# Patient Record
Sex: Female | Born: 1940 | Race: White | Hispanic: No | Marital: Married | Smoking: Former smoker
Health system: Southern US, Community
[De-identification: ages and names within clinical notes are randomized; demographics above are authoritative.]

## PROBLEM LIST (undated history)

## (undated) DIAGNOSIS — F419 Anxiety disorder, unspecified: Secondary | ICD-10-CM

## (undated) DIAGNOSIS — F329 Major depressive disorder, single episode, unspecified: Secondary | ICD-10-CM

## (undated) DIAGNOSIS — F32A Depression, unspecified: Secondary | ICD-10-CM

## (undated) DIAGNOSIS — E039 Hypothyroidism, unspecified: Secondary | ICD-10-CM

## (undated) DIAGNOSIS — M1990 Unspecified osteoarthritis, unspecified site: Secondary | ICD-10-CM

## (undated) DIAGNOSIS — N200 Calculus of kidney: Secondary | ICD-10-CM

## (undated) DIAGNOSIS — M109 Gout, unspecified: Secondary | ICD-10-CM

## (undated) HISTORY — PX: HEMORROIDECTOMY: SUR656

## (undated) HISTORY — PX: BACK SURGERY: SHX140

## (undated) HISTORY — PX: TONSILLECTOMY: SUR1361

## (undated) HISTORY — PX: EYE SURGERY: SHX253

---

## 1965-02-02 HISTORY — PX: DILATION AND CURETTAGE OF UTERUS: SHX78

## 1997-06-22 ENCOUNTER — Other Ambulatory Visit: Admission: RE | Admit: 1997-06-22 | Discharge: 1997-06-22 | Payer: Self-pay | Admitting: Family Medicine

## 1997-08-24 ENCOUNTER — Other Ambulatory Visit: Admission: RE | Admit: 1997-08-24 | Discharge: 1997-08-24 | Payer: Self-pay | Admitting: *Deleted

## 1998-03-14 ENCOUNTER — Ambulatory Visit (HOSPITAL_COMMUNITY): Admission: RE | Admit: 1998-03-14 | Discharge: 1998-03-14 | Payer: Self-pay | Admitting: Gastroenterology

## 1998-07-24 ENCOUNTER — Other Ambulatory Visit: Admission: RE | Admit: 1998-07-24 | Discharge: 1998-07-24 | Payer: Self-pay | Admitting: Internal Medicine

## 1998-07-30 ENCOUNTER — Encounter: Payer: Self-pay | Admitting: Emergency Medicine

## 1998-07-30 ENCOUNTER — Emergency Department (HOSPITAL_COMMUNITY): Admission: EM | Admit: 1998-07-30 | Discharge: 1998-07-30 | Payer: Self-pay | Admitting: Emergency Medicine

## 1999-08-14 ENCOUNTER — Other Ambulatory Visit: Admission: RE | Admit: 1999-08-14 | Discharge: 1999-08-14 | Payer: Self-pay | Admitting: Gynecology

## 1999-12-02 ENCOUNTER — Encounter: Payer: Self-pay | Admitting: Gynecology

## 1999-12-02 ENCOUNTER — Encounter: Admission: RE | Admit: 1999-12-02 | Discharge: 1999-12-02 | Payer: Self-pay | Admitting: Gynecology

## 2000-10-28 ENCOUNTER — Other Ambulatory Visit: Admission: RE | Admit: 2000-10-28 | Discharge: 2000-10-28 | Payer: Self-pay | Admitting: Gynecology

## 2000-12-09 ENCOUNTER — Encounter: Payer: Self-pay | Admitting: Internal Medicine

## 2000-12-09 ENCOUNTER — Encounter: Admission: RE | Admit: 2000-12-09 | Discharge: 2000-12-09 | Payer: Self-pay | Admitting: Internal Medicine

## 2000-12-10 ENCOUNTER — Ambulatory Visit (HOSPITAL_COMMUNITY): Admission: RE | Admit: 2000-12-10 | Discharge: 2000-12-10 | Payer: Self-pay | Admitting: Sports Medicine

## 2000-12-10 ENCOUNTER — Encounter: Payer: Self-pay | Admitting: Sports Medicine

## 2000-12-29 ENCOUNTER — Encounter: Payer: Self-pay | Admitting: Sports Medicine

## 2000-12-29 ENCOUNTER — Encounter: Admission: RE | Admit: 2000-12-29 | Discharge: 2000-12-29 | Payer: Self-pay | Admitting: Sports Medicine

## 2001-01-12 ENCOUNTER — Encounter: Admission: RE | Admit: 2001-01-12 | Discharge: 2001-01-12 | Payer: Self-pay | Admitting: Sports Medicine

## 2001-01-12 ENCOUNTER — Encounter: Payer: Self-pay | Admitting: Sports Medicine

## 2001-02-16 ENCOUNTER — Encounter: Payer: Self-pay | Admitting: Sports Medicine

## 2001-02-16 ENCOUNTER — Encounter: Admission: RE | Admit: 2001-02-16 | Discharge: 2001-02-16 | Payer: Self-pay | Admitting: Sports Medicine

## 2001-10-12 ENCOUNTER — Ambulatory Visit (HOSPITAL_COMMUNITY): Admission: RE | Admit: 2001-10-12 | Discharge: 2001-10-12 | Payer: Self-pay | Admitting: *Deleted

## 2002-04-30 ENCOUNTER — Emergency Department (HOSPITAL_COMMUNITY): Admission: EM | Admit: 2002-04-30 | Discharge: 2002-04-30 | Payer: Self-pay | Admitting: Emergency Medicine

## 2002-04-30 ENCOUNTER — Encounter: Payer: Self-pay | Admitting: Emergency Medicine

## 2002-05-03 ENCOUNTER — Ambulatory Visit (HOSPITAL_BASED_OUTPATIENT_CLINIC_OR_DEPARTMENT_OTHER): Admission: RE | Admit: 2002-05-03 | Discharge: 2002-05-03 | Payer: Self-pay | Admitting: Urology

## 2002-10-04 ENCOUNTER — Encounter: Payer: Self-pay | Admitting: Internal Medicine

## 2002-10-04 ENCOUNTER — Encounter: Admission: RE | Admit: 2002-10-04 | Discharge: 2002-10-04 | Payer: Self-pay | Admitting: Internal Medicine

## 2004-05-22 ENCOUNTER — Encounter: Admission: RE | Admit: 2004-05-22 | Discharge: 2004-05-22 | Payer: Self-pay | Admitting: Internal Medicine

## 2005-04-16 ENCOUNTER — Other Ambulatory Visit: Admission: RE | Admit: 2005-04-16 | Discharge: 2005-04-16 | Payer: Self-pay | Admitting: Internal Medicine

## 2005-08-13 ENCOUNTER — Encounter: Admission: RE | Admit: 2005-08-13 | Discharge: 2005-08-13 | Payer: Self-pay | Admitting: Internal Medicine

## 2006-10-27 ENCOUNTER — Encounter: Admission: RE | Admit: 2006-10-27 | Discharge: 2006-10-27 | Payer: Self-pay | Admitting: Internal Medicine

## 2006-11-12 ENCOUNTER — Encounter: Admission: RE | Admit: 2006-11-12 | Discharge: 2006-11-12 | Payer: Self-pay | Admitting: Internal Medicine

## 2007-05-17 ENCOUNTER — Ambulatory Visit (HOSPITAL_COMMUNITY): Admission: RE | Admit: 2007-05-17 | Discharge: 2007-05-18 | Payer: Self-pay | Admitting: Neurosurgery

## 2007-12-20 ENCOUNTER — Encounter: Admission: RE | Admit: 2007-12-20 | Discharge: 2007-12-20 | Payer: Self-pay | Admitting: Internal Medicine

## 2008-07-17 ENCOUNTER — Emergency Department (HOSPITAL_COMMUNITY): Admission: EM | Admit: 2008-07-17 | Discharge: 2008-07-17 | Payer: Self-pay | Admitting: Family Medicine

## 2009-01-14 ENCOUNTER — Encounter: Admission: RE | Admit: 2009-01-14 | Discharge: 2009-01-14 | Payer: Self-pay | Admitting: Internal Medicine

## 2009-09-02 ENCOUNTER — Encounter: Admission: RE | Admit: 2009-09-02 | Discharge: 2009-09-02 | Payer: Self-pay | Admitting: Internal Medicine

## 2010-02-06 ENCOUNTER — Encounter
Admission: RE | Admit: 2010-02-06 | Discharge: 2010-02-06 | Payer: Self-pay | Source: Home / Self Care | Attending: Internal Medicine | Admitting: Internal Medicine

## 2010-06-17 NOTE — Op Note (Signed)
NAMETRINTY, MARKEN NO.:  [id number]   MEDICAL RECORD NO.:  [id number]          PATIENT TYPE:  OIB   LOCATION:  3012                         FACILITY:  [facility]   PHYSICIAN:  Payton Doughty, M.D.      DATE OF BIRTH:  1940/11/25   DATE OF PROCEDURE:  05/17/2007  DATE OF DISCHARGE:                               OPERATIVE REPORT   PREOPERATIVE DIAGNOSIS:  Foraminal disk on the left side at L3-4.   POSTOPERATIVE DIAGNOSIS:  Foraminal disk on the left side at L3-4.   PROCEDURES:  Left L3-4 foraminal diskectomy.   SURGEON:  Payton Doughty, M.D.   ANESTHESIA:  Endotracheal.   PREP:  Prepped with alcohol wipe.   COMPLICATIONS:  None.   NURSE ASSISTANT:  [place].   BODY TEXT:  This is a 70 year old lady with a left L3 radiculopathy and  a foraminal disk on the left side at L3-4, taken to the operating room  where spinal anesthesia was induced, placed prone on the operating  table.  Following shave, prep, and drape in the usual sterile fashion,  the skin was infiltrated with 1% lidocaine with 1:100,000 epinephrine  and the skin was incised over top of the lamina of L3.  It was exposed  on the left side in the subperiosteal plane.  Intraoperative x-ray was  obtained to confirm correct level.  The inferior part, the lateral part  of the lamina, and the superior part of the inferior articular process  of L3, and the superior part of the inferior articular process of L4  were removed using a high-speed drill.  This allowed access to the  axillary area where the dorsal root ganglion of L3 and the lateral  margin of the thecal sac were identified after removal of ligamentum  flavum.  Immediately obvious was a foraminal disk bulge which had some  fissuring of the annulus causing mass effect on the dorsal root  ganglion.  This was removed without difficulty.  Following this removal,  the nerve extruded freely as in palpation on the thecal sac revealed no  compression there.   Wound was irrigated.  Hemostasis assured.  The  laminotomy defect was packed with Depo-Medrol-soaked pack.  Successive  layers of 0 Vicryl, 2-0 Vicryl, and 3-0 nylon were used to close.  Betadine and Telfa dressing was applied and then cleansed with OpSite.  The patient returned to the recovery room in good condition.           ______________________________  Payton Doughty, M.D.     MWR/MEDQ  D:  05/17/2007  T:  05/17/2007  Job:  [id number]

## 2010-06-17 NOTE — H&P (Signed)
Kerry Ramsey, Kerry Ramsey            ACCOUNT NO.:  [id number]   MEDICAL RECORD NO.:  [id number]          PATIENT TYPE:  AMB   LOCATION:  SDS                          FACILITY:  [facility]   PHYSICIAN:  Payton Doughty, M.D.      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   This 70 year old right-handed white lady has had back pain off and on  for a number of years.  Epidural steroid injections in the past have  helped.  In September, she had the onset of left groin pain.  MRI showed  a foraminal disk of the left side.  She was referred to me.  She  underwent epidural steroids.  It helped transiently.  Pain came back and  she is here now for a foraminal diskectomy.  Medical history is  remarkable for Graves disease.  She is on Synthroid, Paxil, and Fosamax.   ALLERGIES:  None.   SURGICAL HISTORY:  Hemorrhoid in 1969.   SOCIAL HISTORY:  She does not smoke, drinks on a social basis.   FAMILY HISTORY:  Both parents have deceased, history is not given.   REVIEW OF SYSTEMS:  CONSTITUTIONAL:  Remarkable for glasses, nasal  congestion, sinus problem, back pain, leg pain, and depression.  HEENT:  Within normal limits.  NECK:  She has reasonable range of motion of the  neck.  CHEST:  Clear.  CARDIAC:  Regular rate and rhythm.  ABDOMEN:  Nontender.  No hepatosplenomegaly.  EXTREMITIES:  Without clubbing or  cyanosis.  GU:  Exam deferred.  EXTREMITIES:  Peripheral pulses are  good.  NEUROLOGIC:  She is awake, alert, and oriented.  Cranial nerves  are intact.  Motor exam shows 5/5 strength throughout the upper and  lower extremities.  Sensory dysesthesias described at left L3  distribution.  Reflexes are 2 at the right knee, 1 at the left, 2 at the  ankles bilaterally.  Straight leg raise is negative.  There is no hip  irritability.   MRI shows foraminal disk on the left side at L3-L4.   CLINICAL IMPRESSION:  Left L3 radiculopathy secondary to foraminal disk.   The plan is for a foraminal diskectomy on the left side at L3-L4.  The  risks and benefits have been discussed with her and she wishes to  proceed.           ______________________________  Payton Doughty, M.D.     MWR/MEDQ  D:  05/17/2007  T:  05/17/2007  Job:  [id number]

## 2010-06-20 NOTE — Op Note (Signed)
Kerry Ramsey, Kerry Ramsey NO.:  [id number]   MEDICAL RECORD NO.:  [id number]                   PATIENT TYPE:  AMB   LOCATION:  NESC                                 FACILITY:  [facility]   PHYSICIAN:  Valetta Fuller, M.D.               DATE OF BIRTH:  09-04-1940   DATE OF PROCEDURE:  DATE OF DISCHARGE:                                 OPERATIVE REPORT   PREOPERATIVE DIAGNOSIS:  Left distal ureteral stone.   POSTOPERATIVE DIAGNOSIS:  Left distal ureteral stone.   PROCEDURE PERFORMED:  Cystoscopy, retrograde pyelography, ureteroscopy,  stone basketing, and double-J stent placement.   SURGEON:  Valetta Fuller, M.D.   ANESTHESIA:  General.   INDICATIONS:  Ms. Burkle is a 70 year old female.  She has a history of  nephrolithiasis.  She presented to the emergency room recently with severe  flank discomfort.  A CT showed mild hydronephrosis and what appeared to be a  very small, 1-2 mm distal left ureteral stone.  We saw the patient in  followup, and she was continuing to have pain.  We encouraged her to try to  pass this spontaneously, given its small size, but she needed to leave town  in the next couple of days for , and therefore, requested that  definitive intervention be performed.  She presented this morning for that  and was also having excruciating flank discomfort preoperatively.   TECHNIQUE AND FINDINGS:  The patient was brought to the operating room where  she had successful induction of general anesthesia.  She was placed in the  lithotomy position and prepped and draped in the usual manner.  A cystoscopy  revealed a markedly edematous and inflamed left hemitrigone.  I had really  never seen this kind of inflammatory reaction to such a small stone, based  on what we had seen on the CT scan.  I have only seen this type of  inflammation with a very large stone impacted in the intramural ureter.  Given the distortion of the trigone, it was  a little bit difficult to  actually get a wire into the orifice.  We had some difficulty advancing it,  but through the open end, we could see on retrograde pyelography that we  were indeed within the lumen, and the ureter itself appeared dilated with a  small filling defect distally.  We were eventually able to get a glide wire  up to the left renal pelvis.  I used the inside portion of an access sheath  to gently dilate the distal ureter.  After that was accomplished, we used a  6 [place] mini rigid ureteroscope.  Two 1 mm fragments were encountered,  which probably broke up upon inserting the dilating sheath.  Both of these  little fragments were basket extracted.  The entire intramural ureter  appeared to be markedly inflamed and edematous.  The more proximal aspect of  the ureter  was entirely normal.  Given the amount of edema, we felt a stent  should be placed.  Over the guide wire, I placed a 4.5 French 24 cm double-J  stent, which was done visually, as well as with fluoroscopic guidance.  The  patient appeared to tolerate the procedure well.  She was brought to the  recovery room in stable condition.                                               Valetta Fuller, M.D.    DSG/MEDQ  D:  05/03/2002  T:  05/03/2002  Job:  [id number]

## 2010-10-28 LAB — CBC
Platelets: 321
RDW: 13.4
WBC: 8.1

## 2010-10-28 LAB — COMPREHENSIVE METABOLIC PANEL
ALT: 21
AST: 20
Alkaline Phosphatase: 57
CO2: 26
Calcium: 9.6
Chloride: 104
GFR calc Af Amer: >60
Glucose, Bld: 89
Potassium: 3.4 — ABNORMAL LOW
Sodium: 140
Total Bilirubin: 0.3

## 2010-10-28 LAB — DIFFERENTIAL
Basophils Absolute: 0
Basophils Relative: 0
Eosinophils Absolute: 0.1
Monocytes Relative: 8
Neutro Abs: 5.4
Neutrophils Relative %: 66

## 2010-10-28 LAB — URINALYSIS, ROUTINE W REFLEX MICROSCOPIC
Bilirubin Urine: NEGATIVE
Ketones, ur: 15 — AB
Urobilinogen, UA: 1

## 2010-10-28 LAB — APTT: aPTT: 30

## 2010-10-28 LAB — PROTIME-INR: Prothrombin Time: 12.3

## 2010-12-11 ENCOUNTER — Other Ambulatory Visit: Payer: Self-pay | Admitting: Neurological Surgery

## 2010-12-11 DIAGNOSIS — M5416 Radiculopathy, lumbar region: Secondary | ICD-10-CM

## 2010-12-11 DIAGNOSIS — M549 Dorsalgia, unspecified: Secondary | ICD-10-CM

## 2010-12-16 ENCOUNTER — Ambulatory Visit
Admission: RE | Admit: 2010-12-16 | Discharge: 2010-12-16 | Disposition: A | Discharge status: Home / Self Care | Payer: Medicare Other | Source: Ambulatory Visit | Attending: Neurological Surgery | Admitting: Neurological Surgery

## 2010-12-16 VITALS — BP 148/78 | HR 67

## 2010-12-16 DIAGNOSIS — M549 Dorsalgia, unspecified: Secondary | ICD-10-CM

## 2010-12-16 DIAGNOSIS — M5416 Radiculopathy, lumbar region: Secondary | ICD-10-CM

## 2010-12-16 MED ORDER — IOHEXOL 180 MG/ML  SOLN
1.0000 mL | Freq: Once | INTRAMUSCULAR | Status: AC | PRN
  Administered 2010-12-16: 1 mL via EPIDURAL

## 2010-12-16 MED ORDER — METHYLPREDNISOLONE ACETATE 40 MG/ML INJ SUSP (RADIOLOG
120.0000 mg | Freq: Once | INTRAMUSCULAR | Status: AC
  Administered 2010-12-16: 120 mg via EPIDURAL

## 2011-02-12 DIAGNOSIS — M545 Low back pain, unspecified: Secondary | ICD-10-CM | POA: Diagnosis not present

## 2011-02-19 ENCOUNTER — Other Ambulatory Visit (HOSPITAL_COMMUNITY): Payer: Self-pay | Admitting: Neurological Surgery

## 2011-02-19 ENCOUNTER — Other Ambulatory Visit: Payer: Self-pay | Admitting: Neurological Surgery

## 2011-02-19 DIAGNOSIS — M5416 Radiculopathy, lumbar region: Secondary | ICD-10-CM

## 2011-02-19 DIAGNOSIS — H93299 Other abnormal auditory perceptions, unspecified ear: Secondary | ICD-10-CM | POA: Diagnosis not present

## 2011-02-19 DIAGNOSIS — M545 Low back pain, unspecified: Secondary | ICD-10-CM

## 2011-02-19 DIAGNOSIS — H6120 Impacted cerumen, unspecified ear: Secondary | ICD-10-CM | POA: Diagnosis not present

## 2011-03-05 ENCOUNTER — Ambulatory Visit (HOSPITAL_COMMUNITY)
Admission: RE | Admit: 2011-03-05 | Discharge: 2011-03-05 | Disposition: A | Discharge status: Home / Self Care | Payer: Medicare Other | Source: Ambulatory Visit | Attending: Neurological Surgery | Admitting: Neurological Surgery

## 2011-03-05 DIAGNOSIS — M51379 Other intervertebral disc degeneration, lumbosacral region without mention of lumbar back pain or lower extremity pain: Secondary | ICD-10-CM | POA: Insufficient documentation

## 2011-03-05 DIAGNOSIS — M5137 Other intervertebral disc degeneration, lumbosacral region: Secondary | ICD-10-CM | POA: Diagnosis not present

## 2011-03-05 DIAGNOSIS — IMO0002 Reserved for concepts with insufficient information to code with codable children: Secondary | ICD-10-CM | POA: Diagnosis not present

## 2011-03-05 DIAGNOSIS — M5416 Radiculopathy, lumbar region: Secondary | ICD-10-CM

## 2011-03-05 DIAGNOSIS — M4180 Other forms of scoliosis, site unspecified: Secondary | ICD-10-CM | POA: Insufficient documentation

## 2011-03-05 DIAGNOSIS — I700 Atherosclerosis of aorta: Secondary | ICD-10-CM | POA: Diagnosis not present

## 2011-03-05 DIAGNOSIS — M545 Low back pain, unspecified: Secondary | ICD-10-CM

## 2011-03-05 DIAGNOSIS — M4120 Other idiopathic scoliosis, site unspecified: Secondary | ICD-10-CM | POA: Diagnosis not present

## 2011-03-05 DIAGNOSIS — M439 Deforming dorsopathy, unspecified: Secondary | ICD-10-CM | POA: Diagnosis not present

## 2011-03-05 MED ORDER — DIAZEPAM 5 MG PO TABS
10.0000 mg | ORAL_TABLET | Freq: Once | ORAL | Status: AC
  Administered 2011-03-05: 10 mg via ORAL

## 2011-03-05 MED ORDER — ONDANSETRON HCL 4 MG/2ML IJ SOLN: 4.0000 mg | Freq: Four times a day (QID) | INTRAMUSCULAR | Status: DC | PRN

## 2011-03-05 MED ORDER — DIAZEPAM 5 MG PO TABS
ORAL_TABLET | ORAL | Status: AC
  Administered 2011-03-05: 10 mg via ORAL
  Filled 2011-03-05: qty 2

## 2011-03-05 MED ORDER — IOHEXOL 180 MG/ML  SOLN
20.0000 mL | Freq: Once | INTRAMUSCULAR | Status: AC | PRN
  Administered 2011-03-05: 20 mL via INTRATHECAL

## 2011-03-05 NOTE — Procedures (Signed)
Kerry Ramsey  #[id number]  DOB:  April 05, 1940  02/12/2011:  Kerry Ramsey returns to the office today.  When I had last seen her in November I suggested an epidural steroid injection which was done via the transforaminal route at L3-4 on the right side.  She notes she had good relief for about a day's time, but beyond that the pain returned.  This not unlike her previous experience with epidural steroid injections.    I again had the opportunity to review her radiographs and particularly noted on her pelvic film that the radiologist saw some bone islands above the right acetabulum.  She notes that she was told about these previously when she had a CT scan and they do appear to be stable compared to her previous films.    The findings on the lumbar spine demonstrate that she has that 22 to 23 degree curve between L4 and L1 and I noted that imaging of this process would best to be done with a myelogram and a myelogram CAT scan.  I discussed with her the technique of the myelogram and the fact that it is done as an outpatient procedure at the [facility].  She would like to consider this and proceed with that at some point in the future and I believe that it would be appropriate to determine whether anything can be done to help minimize or deconstruct this scoliosis or whether it would be better to consider further conservative management.  She did note that she gets some cramps into both her lower extremities and this may be a complicating feature of some significant stenosis, although I can't appreciate this on the plain x-rays themselves.  We will see what we can find on the myelogram.          [name], M.D./sv Kerry Pock. [place]  #[id number]  DOB:  11-13-1940  12/10/2010:  Kerry Ramsey returns to the office today.  She had been seen and followed by Dr. Trey Sailors and back in 2009 she had undergone surgical decompression of a foraminal disc protrusion at L3-4 on the left side.  She notes that prior to that  she had been having considerable problems with left leg pain and had a couple of nerve blocks which didn't seem to help much, but after the diskectomy she notes that the bulk of her left lower extremity pain and discomfort had gone away.  Nonetheless, she kept some pain in her back.  It was noted that she had a degenerative scoliosis and she had marked degenerative change at the L3-4 disc space.  Now, she notes that she gets mostly pain in the right side of the lumbosacral junction.  She is acutely tender to palpation in the region just above the posterior superior iliac crest near the lumbosacral junction.  Her motor function appears good in the lower extremities.    Today in the office we obtained some new plain radiographs of the lumbar spine which demonstrate that she has a 22 degree scoliosis from the bottom of L3 to the top of L1 and this has an apex to the right side, concavity is on the left side.  She does have some modest degenerative changes at L4-L5, particularly on that right side.    IMPRESSION:     The patient has degenerative lumbar scoliosis and currently she is suffering from pain right in the lumbosacral junction on the right side.  I have suggested that perhaps a transforaminal block at L4-L5 on the right  may help to alleviate the worst of the symptoms as I suspect they are mostly driven by L4.  Kerry Ramsey tells me that previously her injections did little to help more than just a day and I am hopeful that a block in the right place should give her more substantial relief.  If it is not helping her significantly, then I have suggested that she proceed with myelography and a postmyelogram CAT scan in an effort to better define the pathoanatomy in this region.          [name], M.D./sv

## 2011-03-05 NOTE — Procedures (Signed)
Pre op Dx: Lumbar spondylosis, scoliosis, myelopathy Post op Dx: March spondylosis, scoliosis, myelopathy Procedure: A more myelogram Surgeon: Kaesen Rodriguez Puncture level: L4-5 and L1 2 Fluid color: Hearing colorless Injection: 14 cc iohexol 180 Findings: Spondylosis with scoliosis and mid lumbar spine.

## 2011-03-09 ENCOUNTER — Telehealth (HOSPITAL_COMMUNITY): Payer: Self-pay

## 2011-03-10 ENCOUNTER — Other Ambulatory Visit: Payer: Self-pay | Admitting: Neurological Surgery

## 2011-04-24 ENCOUNTER — Encounter (HOSPITAL_COMMUNITY): Payer: Self-pay | Admitting: Pharmacy Technician

## 2011-04-28 ENCOUNTER — Other Ambulatory Visit (HOSPITAL_COMMUNITY): Payer: Self-pay | Admitting: *Deleted

## 2011-04-29 ENCOUNTER — Encounter (HOSPITAL_COMMUNITY)
Admission: RE | Admit: 2011-04-29 | Discharge: 2011-04-29 | Disposition: A | Discharge status: Home / Self Care | Payer: Medicare Other | Source: Ambulatory Visit | Attending: Neurological Surgery | Admitting: Neurological Surgery

## 2011-04-29 ENCOUNTER — Encounter (HOSPITAL_COMMUNITY): Payer: Self-pay

## 2011-04-29 HISTORY — DX: Calculus of kidney: N20.0

## 2011-04-29 HISTORY — DX: Major depressive disorder, single episode, unspecified: F32.9

## 2011-04-29 HISTORY — DX: Depression, unspecified: F32.A

## 2011-04-29 HISTORY — DX: Hypothyroidism, unspecified: E03.9

## 2011-04-29 HISTORY — DX: Anxiety disorder, unspecified: F41.9

## 2011-04-29 HISTORY — DX: Unspecified osteoarthritis, unspecified site: M19.90

## 2011-04-29 LAB — SURGICAL PCR SCREEN
MRSA, PCR: NEGATIVE
Staphylococcus aureus: POSITIVE — AB

## 2011-04-29 LAB — BASIC METABOLIC PANEL
BUN: 15 mg/dL (ref 6–23)
CO2: 23 mEq/L (ref 19–32)
Calcium: 9.2 mg/dL (ref 8.4–10.5)
Creatinine, Ser: 0.57 mg/dL (ref 0.50–1.10)

## 2011-04-29 LAB — CBC
HCT: 38.7 % (ref 36.0–46.0)
MCH: 30.8 pg (ref 26.0–34.0)
MCHC: 34.4 g/dL (ref 30.0–36.0)
RDW: 12.7 % (ref 11.5–15.5)

## 2011-04-29 LAB — ABO/RH: ABO/RH(D): A POS

## 2011-04-29 LAB — TYPE AND SCREEN: Antibody Screen: NEGATIVE

## 2011-04-29 NOTE — Pre-Procedure Instructions (Signed)
20 Kerry Ramsey  04/29/2011   Your procedure is scheduled on:  05/04/2011  Report to [facility] at 8:30a.m.  Call this number if you have problems the morning of surgery: [phone]   Remember:   Do not eat food:After Midnight.  May have clear liquids: up to 4 Hours before arrival.  NOTHING AFTER 4:30a.m.  Clear liquids include soda, tea, black coffee, apple or grape juice, broth.  Take these medicines the morning of surgery with A SIP OF WATER: Synthroid   Do not wear jewelry, make-up or nail polish.  Do not wear lotions, powders, or perfumes. You may wear deodorant.  Do not shave 48 hours prior to surgery.  Do not bring valuables to the hospital.  Contacts, dentures or bridgework may not be worn into surgery.  Leave suitcase in the car. After surgery it may be brought to your room.  For patients admitted to the hospital, checkout time is 11:00 AM the day of discharge.   Patients discharged the day of surgery will not be allowed to drive home.  Name and phone number of your driver: with SPOU[employer]  Special Instructions: CHG Shower Use Special Wash: 1/2 bottle night before surgery and 1/2 bottle morning of surgery.   Please read over the following fact sheets that you were given: Pain Booklet, Coughing and Deep Breathing, Blood Transfusion Information, MRSA Information and Surgical Site Infection Prevention

## 2011-05-01 ENCOUNTER — Other Ambulatory Visit (HOSPITAL_COMMUNITY): Payer: Medicare Other

## 2011-05-03 MED ORDER — CEFAZOLIN SODIUM 1-5 GM-% IV SOLN
1.0000 g | INTRAVENOUS | Status: DC
  Filled 2011-05-03: qty 50

## 2011-05-03 NOTE — H&P (Signed)
Kerry Ramsey is an 71 y.o. female.   Chief Complaint: Back and leg pain [id number] L. [place] #[id number] DOB:  1940/12/10 03/05/2011:  Kerry Ramsey had a myelogram today. I discussed the results with her in the hospital. She has evidence of advanced spondylitic disease at L2-3, 3-4, and 4-5, with 3-4 being the singular worst level. She has lateral listhesis and some lateral recess stenosis particularly worse on the right, which is the side of her worst lumbar radicular pain.    I have advised that she should undergo an anterior decompression via a lateral approach at L2-3, 3-4, and 4-5.  We would use an XLIF device to stabilize this area and then do percutaneous pedicle screws posteriorly at L2, L3, L4, and L5.  Thereafter, the patient should have good relief.    I discussed the nature of the surgery, the major risks, bleeding, potential for a spinal injury.  Notwithstanding these risks, I believe the patient is a good candidate for surgical decompression and arthrodesis. I noted that she would be expected to be in the hospital about four days and thereafter she would require the use of an external brace for a  period of about eight weeks. This would only be used when she is up and out of bed.  Her total recovery takes upwards of four to six months.  She would like to do the surgery some time in the beginning of April and we will try to accommodate her needs as best possible. We will call in some Tramadol 50 mg., #40, one po q6h prn pain.           [name], M.D./aft  [place]  #[id number]  DOB:  [birth year]  12/10/2010:  Kerry Ramsey returns to the office today.  She had been seen and followed by Dr. Trey Sailors and back in 2009 she had undergone surgical decompression of a foraminal disc protrusion at L3-4 on the left side.  She notes that prior to that she had been having considerable problems with left leg pain and had a couple of nerve blocks which didn't seem to help much, but after the  diskectomy she notes that the bulk of her left lower extremity pain and discomfort had gone away.  Nonetheless, she kept some pain in her back.  It was noted that she had a degenerative scoliosis and she had marked degenerative change at the L3-4 disc space.  Now, she notes that she gets mostly pain in the right side of the lumbosacral junction.  She is acutely tender to palpation in the region just above the posterior superior iliac crest near the lumbosacral junction.  Her motor function appears good in the lower extremities.    Today in the office we obtained some new plain radiographs of the lumbar spine which demonstrate that she has a 22 degree scoliosis from the bottom of L3 to the top of L1 and this has an apex to the right side, concavity is on the left side.  She does have some modest degenerative changes at L4-L5, particularly on that right side.    IMPRESSION:     The patient has degenerative lumbar scoliosis and currently she is suffering from pain right in the lumbosacral junction on the right side.  I have suggested that perhaps a transforaminal block at L4-L5 on the right may help to alleviate the worst of the symptoms as I suspect they are mostly driven by L4.  Kerry Ramsey tells me that previously her injections  did little to help more than just a day and I am hopeful that a block in the right place should give her more substantial relief.  If it is not helping her significantly, then I have suggested that she proceed with myelography and a postmyelogram CAT scan in an effort to better define the pathoanatomy in this region.          [name], M.D./sv   Past Medical History  Diagnosis Date  . Hypothyroidism     treatment for Graves disease  . Depression   . Anxiety     panic attack many yrs. ago   . Arthritis     OA- hands & knees, lumbar stenosis, radiculopathy   . Renal calculi     x3 episodes     Past Surgical History  Procedure Date  . Back surgery     2009,  diskectomy lumbar   . Hemorroidectomy     45 yrs. ago  . Tonsillectomy     as a child   . Eye surgery     cataracts removed bilateral - /w IOL     Family History  Problem Relation Age of Onset  . Anesthesia problems Neg Hx    Social History:  reports that she quit smoking about 23 years ago. She does not have any smokeless tobacco history on file. She reports that she drinks alcohol. She reports that she does not use illicit drugs.  Allergies: No Known Allergies  Medications Prior to Admission  Medication Dose Route Frequency Provider Last Rate Last Dose  . ceFAZolin (ANCEF) IVPB 1 g/50 mL premix  1 g Intravenous 60 min Pre-Op Barnett Abu, MD       Medications Prior to Admission  Medication Sig Dispense Refill  . calcium elemental as carbonate (BARIATRIC TUMS ULTRA) 400 MG tablet Chew 1,000 mg by mouth 2 (two) times daily.      . Doxylamine Succinate, Sleep, (SLEEP AID PO) Take 0.5 tablets by mouth at bedtime as needed. For sleep      . ibuprofen (ADVIL,MOTRIN) 600 MG tablet Take 600 mg by mouth every 6 (six) hours as needed. For pain      . levothyroxine (SYNTHROID, LEVOTHROID) 50 MCG tablet Take 50 mcg by mouth every other day. Alternate days with synthroid 75 mcg      . levothyroxine (SYNTHROID, LEVOTHROID) 75 MCG tablet Take 75 mcg by mouth every other day. Alternate days with synthroid 50 mcg      . PARoxetine (PAXIL) 30 MG tablet Take 30 mg by mouth every evening.       . traZODone (DESYREL) 50 MG tablet Take 25 mg by mouth at bedtime as needed.         No results found for this or any previous visit (from the past 48 hour(s)). No results found.  Review of Systems  Constitutional: Negative.   HENT: Negative.   Eyes: Negative.   Respiratory: Negative.   Cardiovascular: Negative.   Gastrointestinal: Negative.   Genitourinary: Negative.   Musculoskeletal: Positive for back pain and joint pain.  Skin: Negative.   Neurological: Positive for sensory change and focal  weakness.  Endo/Heme/Allergies: Negative.   Psychiatric/Behavioral: Negative.     There were no vitals taken for this visit. Physical Exam  Constitutional: She is oriented to person, place, and time. She appears well-developed and well-nourished.  HENT:  Head: Normocephalic and atraumatic.  Eyes: Conjunctivae and EOM are normal. Pupils are equal, round, and reactive to light.  Neck: Normal range of motion. Neck supple.  Cardiovascular: Normal rate, regular rhythm and normal heart sounds.   Respiratory: Effort normal and breath sounds normal.  GI: Soft. Bowel sounds are normal.  Musculoskeletal: She exhibits tenderness.  Neurological: She is alert and oriented to person, place, and time. She displays normal reflexes. No cranial nerve deficit. She exhibits normal muscle tone. Coordination normal.  Skin: Skin is warm and dry.  Psychiatric: She has a normal mood and affect. Her behavior is normal. Judgment and thought content normal.     Assessment/Plan Degenerative scoliosis with back and leg pain. Decompression anterolaterally L2 to L5.  Jacalyn Biggs J 05/03/2011, 10:03 PM

## 2011-05-04 ENCOUNTER — Inpatient Hospital Stay (HOSPITAL_COMMUNITY)
Admission: RE | Admit: 2011-05-04 | Discharge: 2011-05-09 | DRG: 458 | Disposition: A | Discharge status: Home / Self Care | Payer: Medicare Other | Source: Ambulatory Visit | Attending: Neurological Surgery | Admitting: Neurological Surgery

## 2011-05-04 ENCOUNTER — Encounter (HOSPITAL_COMMUNITY): Payer: Self-pay | Admitting: Anesthesiology

## 2011-05-04 ENCOUNTER — Encounter (HOSPITAL_COMMUNITY): Payer: Self-pay | Admitting: *Deleted

## 2011-05-04 ENCOUNTER — Inpatient Hospital Stay (HOSPITAL_COMMUNITY): Payer: Medicare Other

## 2011-05-04 ENCOUNTER — Encounter (HOSPITAL_COMMUNITY)
Admission: RE | Disposition: A | Discharge status: Home / Self Care | Payer: Self-pay | Source: Ambulatory Visit | Attending: Neurological Surgery

## 2011-05-04 ENCOUNTER — Inpatient Hospital Stay (HOSPITAL_COMMUNITY): Payer: Medicare Other | Admitting: Anesthesiology

## 2011-05-04 DIAGNOSIS — Z981 Arthrodesis status: Secondary | ICD-10-CM | POA: Diagnosis not present

## 2011-05-04 DIAGNOSIS — F3289 Other specified depressive episodes: Secondary | ICD-10-CM | POA: Diagnosis present

## 2011-05-04 DIAGNOSIS — IMO0002 Reserved for concepts with insufficient information to code with codable children: Secondary | ICD-10-CM | POA: Diagnosis not present

## 2011-05-04 DIAGNOSIS — E039 Hypothyroidism, unspecified: Secondary | ICD-10-CM | POA: Diagnosis present

## 2011-05-04 DIAGNOSIS — M1710 Unilateral primary osteoarthritis, unspecified knee: Secondary | ICD-10-CM | POA: Diagnosis present

## 2011-05-04 DIAGNOSIS — M47817 Spondylosis without myelopathy or radiculopathy, lumbosacral region: Secondary | ICD-10-CM | POA: Diagnosis not present

## 2011-05-04 DIAGNOSIS — M19049 Primary osteoarthritis, unspecified hand: Secondary | ICD-10-CM | POA: Diagnosis present

## 2011-05-04 DIAGNOSIS — M4310 Spondylolisthesis, site unspecified: Secondary | ICD-10-CM | POA: Diagnosis not present

## 2011-05-04 DIAGNOSIS — Z23 Encounter for immunization: Secondary | ICD-10-CM | POA: Diagnosis not present

## 2011-05-04 DIAGNOSIS — Z6828 Body mass index (BMI) 28.0-28.9, adult: Secondary | ICD-10-CM | POA: Diagnosis not present

## 2011-05-04 DIAGNOSIS — Z01812 Encounter for preprocedural laboratory examination: Secondary | ICD-10-CM

## 2011-05-04 DIAGNOSIS — Z87891 Personal history of nicotine dependence: Secondary | ICD-10-CM

## 2011-05-04 DIAGNOSIS — M419 Scoliosis, unspecified: Secondary | ICD-10-CM

## 2011-05-04 DIAGNOSIS — M545 Low back pain, unspecified: Secondary | ICD-10-CM | POA: Diagnosis not present

## 2011-05-04 DIAGNOSIS — M4120 Other idiopathic scoliosis, site unspecified: Principal | ICD-10-CM | POA: Diagnosis present

## 2011-05-04 DIAGNOSIS — M48061 Spinal stenosis, lumbar region without neurogenic claudication: Secondary | ICD-10-CM | POA: Diagnosis not present

## 2011-05-04 DIAGNOSIS — F329 Major depressive disorder, single episode, unspecified: Secondary | ICD-10-CM | POA: Diagnosis present

## 2011-05-04 DIAGNOSIS — Z79899 Other long term (current) drug therapy: Secondary | ICD-10-CM

## 2011-05-04 HISTORY — PX: ANTERIOR LAT LUMBAR FUSION: SHX1168

## 2011-05-04 SURGERY — ANTERIOR LATERAL LUMBAR FUSION 3 LEVELS
Anesthesia: General | Site: Spine Lumbar | Wound class: Clean

## 2011-05-04 MED ORDER — NEOSTIGMINE METHYLSULFATE 1 MG/ML IJ SOLN
INTRAMUSCULAR | Status: DC | PRN
  Administered 2011-05-04: 3 mg via INTRAVENOUS

## 2011-05-04 MED ORDER — LACTATED RINGERS IV SOLN
INTRAVENOUS | Status: DC | PRN
  Administered 2011-05-04 (×4): via INTRAVENOUS

## 2011-05-04 MED ORDER — MIDAZOLAM HCL 2 MG/2ML IJ SOLN
INTRAMUSCULAR | Status: AC
  Filled 2011-05-04: qty 2

## 2011-05-04 MED ORDER — PHENOL 1.4 % MT LIQD: 1.0000 | OROMUCOSAL | Status: DC | PRN

## 2011-05-04 MED ORDER — HYDROMORPHONE HCL PF 1 MG/ML IJ SOLN
INTRAMUSCULAR | Status: AC
  Filled 2011-05-04: qty 1

## 2011-05-04 MED ORDER — OXYCODONE-ACETAMINOPHEN 5-325 MG PO TABS
1.0000 | ORAL_TABLET | ORAL | Status: DC | PRN
  Administered 2011-05-05: 1 via ORAL
  Administered 2011-05-05 (×3): 2 via ORAL
  Administered 2011-05-05: 1 via ORAL
  Administered 2011-05-06 – 2011-05-07 (×7): 2 via ORAL
  Filled 2011-05-04 (×9): qty 2
  Filled 2011-05-04 (×2): qty 1
  Filled 2011-05-04: qty 2

## 2011-05-04 MED ORDER — GLYCOPYRROLATE 0.2 MG/ML IJ SOLN
INTRAMUSCULAR | Status: DC | PRN
  Administered 2011-05-04: 0.4 mg via INTRAVENOUS

## 2011-05-04 MED ORDER — ACETAMINOPHEN 325 MG PO TABS: 650.0000 mg | ORAL_TABLET | ORAL | Status: DC | PRN

## 2011-05-04 MED ORDER — HEMOSTATIC AGENTS (NO CHARGE) OPTIME: TOPICAL | Status: DC | PRN

## 2011-05-04 MED ORDER — SODIUM CHLORIDE 0.9 % IV SOLN
INTRAVENOUS | Status: AC
  Filled 2011-05-04: qty 500

## 2011-05-04 MED ORDER — EPHEDRINE SULFATE 50 MG/ML IJ SOLN
INTRAMUSCULAR | Status: DC | PRN
  Administered 2011-05-04: 5 mg via INTRAVENOUS
  Administered 2011-05-04 (×2): 10 mg via INTRAVENOUS
  Administered 2011-05-04: 5 mg via INTRAVENOUS

## 2011-05-04 MED ORDER — LACTATED RINGERS IV SOLN: INTRAVENOUS | Status: DC

## 2011-05-04 MED ORDER — ACETAMINOPHEN 650 MG RE SUPP: 650.0000 mg | RECTAL | Status: DC | PRN

## 2011-05-04 MED ORDER — MORPHINE SULFATE 2 MG/ML IJ SOLN: 0.0500 mg/kg | INTRAMUSCULAR | Status: DC | PRN

## 2011-05-04 MED ORDER — POLYETHYLENE GLYCOL 3350 17 G PO PACK
17.0000 g | PACK | Freq: Every day | ORAL | Status: DC | PRN
  Administered 2011-05-06 – 2011-05-08 (×2): 17 g via ORAL
  Filled 2011-05-04 (×2): qty 1

## 2011-05-04 MED ORDER — SODIUM CHLORIDE 0.9 % IV SOLN: INTRAVENOUS | Status: DC

## 2011-05-04 MED ORDER — BACITRACIN 50000 UNITS IM SOLR
INTRAMUSCULAR | Status: AC
  Filled 2011-05-04: qty 1

## 2011-05-04 MED ORDER — LIDOCAINE-EPINEPHRINE 1 %-1:100000 IJ SOLN
INTRAMUSCULAR | Status: DC | PRN
  Administered 2011-05-04: 15 mL

## 2011-05-04 MED ORDER — MORPHINE SULFATE 4 MG/ML IJ SOLN
1.0000 mg | INTRAMUSCULAR | Status: DC | PRN
  Administered 2011-05-05: 4 mg via INTRAVENOUS
  Filled 2011-05-04 (×2): qty 1

## 2011-05-04 MED ORDER — LIDOCAINE HCL (CARDIAC) 20 MG/ML IV SOLN
INTRAVENOUS | Status: DC | PRN
  Administered 2011-05-04: 50 mg via INTRAVENOUS

## 2011-05-04 MED ORDER — BUPIVACAINE HCL (PF) 0.5 % IJ SOLN
INTRAMUSCULAR | Status: DC | PRN
  Administered 2011-05-04: 15 mL

## 2011-05-04 MED ORDER — MENTHOL 3 MG MT LOZG: 1.0000 | LOZENGE | OROMUCOSAL | Status: DC | PRN

## 2011-05-04 MED ORDER — CEFAZOLIN SODIUM 1-5 GM-% IV SOLN
INTRAVENOUS | Status: DC | PRN
  Administered 2011-05-04: 1 g via INTRAVENOUS

## 2011-05-04 MED ORDER — PROPOFOL 10 MG/ML IV EMUL
INTRAVENOUS | Status: DC | PRN
  Administered 2011-05-04 (×5): 20 mg via INTRAVENOUS
  Administered 2011-05-04: 40 mg via INTRAVENOUS
  Administered 2011-05-04: 160 mg via INTRAVENOUS

## 2011-05-04 MED ORDER — SODIUM CHLORIDE 0.9 % IR SOLN
Status: DC | PRN
  Administered 2011-05-04 (×2)

## 2011-05-04 MED ORDER — 0.9 % SODIUM CHLORIDE (POUR BTL) OPTIME
TOPICAL | Status: DC | PRN
  Administered 2011-05-04 (×2): 1000 mL

## 2011-05-04 MED ORDER — LEVOTHYROXINE SODIUM 50 MCG PO TABS
50.0000 ug | ORAL_TABLET | ORAL | Status: DC
  Administered 2011-05-06 – 2011-05-08 (×2): 50 ug via ORAL
  Filled 2011-05-04 (×3): qty 1

## 2011-05-04 MED ORDER — HYDROMORPHONE HCL PF 1 MG/ML IJ SOLN
0.2500 mg | INTRAMUSCULAR | Status: DC | PRN
  Administered 2011-05-04 (×4): 0.5 mg via INTRAVENOUS

## 2011-05-04 MED ORDER — SODIUM CHLORIDE 0.9 % IV SOLN: 250.0000 mL | INTRAVENOUS | Status: DC

## 2011-05-04 MED ORDER — LEVOTHYROXINE SODIUM 75 MCG PO TABS
75.0000 ug | ORAL_TABLET | ORAL | Status: DC
  Administered 2011-05-05 – 2011-05-09 (×3): 75 ug via ORAL
  Filled 2011-05-04 (×3): qty 1

## 2011-05-04 MED ORDER — ONDANSETRON HCL 4 MG/2ML IJ SOLN
INTRAMUSCULAR | Status: DC | PRN
  Administered 2011-05-04: 4 mg via INTRAVENOUS

## 2011-05-04 MED ORDER — ALUM & MAG HYDROXIDE-SIMETH 200-200-20 MG/5ML PO SUSP: 30.0000 mL | Freq: Four times a day (QID) | ORAL | Status: DC | PRN

## 2011-05-04 MED ORDER — MIDAZOLAM HCL 5 MG/5ML IJ SOLN
INTRAMUSCULAR | Status: DC | PRN
  Administered 2011-05-04: 2 mg via INTRAVENOUS

## 2011-05-04 MED ORDER — SUCCINYLCHOLINE CHLORIDE 20 MG/ML IJ SOLN
INTRAMUSCULAR | Status: DC | PRN
  Administered 2011-05-04: 100 mg via INTRAVENOUS

## 2011-05-04 MED ORDER — TRAZODONE 25 MG HALF TABLET
25.0000 mg | ORAL_TABLET | Freq: Every evening | ORAL | Status: DC | PRN
Start: 2011-05-04 — End: 2011-05-09
  Administered 2011-05-04 – 2011-05-08 (×5): 25 mg via ORAL
  Filled 2011-05-04 (×6): qty 1

## 2011-05-04 MED ORDER — ONDANSETRON HCL 4 MG/2ML IJ SOLN
4.0000 mg | INTRAMUSCULAR | Status: DC | PRN
  Administered 2011-05-05 – 2011-05-08 (×2): 4 mg via INTRAVENOUS
  Filled 2011-05-04 (×2): qty 2

## 2011-05-04 MED ORDER — SODIUM CHLORIDE 0.9 % IJ SOLN
3.0000 mL | INTRAMUSCULAR | Status: DC | PRN
  Administered 2011-05-05: 3 mL via INTRAVENOUS

## 2011-05-04 MED ORDER — CEFAZOLIN SODIUM 1-5 GM-% IV SOLN
1.0000 g | Freq: Three times a day (TID) | INTRAVENOUS | Status: AC
  Administered 2011-05-04 – 2011-05-05 (×2): 1 g via INTRAVENOUS
  Filled 2011-05-04 (×3): qty 50

## 2011-05-04 MED ORDER — SODIUM CHLORIDE 0.9 % IJ SOLN
3.0000 mL | Freq: Two times a day (BID) | INTRAMUSCULAR | Status: DC
  Administered 2011-05-04 – 2011-05-05 (×2): 3 mL via INTRAVENOUS

## 2011-05-04 MED ORDER — PAROXETINE HCL 30 MG PO TABS
30.0000 mg | ORAL_TABLET | Freq: Every evening | ORAL | Status: DC
  Administered 2011-05-04: 30 mg via ORAL
  Filled 2011-05-04 (×2): qty 1

## 2011-05-04 MED ORDER — ROCURONIUM BROMIDE 100 MG/10ML IV SOLN
INTRAVENOUS | Status: DC | PRN
  Administered 2011-05-04: 40 mg via INTRAVENOUS

## 2011-05-04 MED ORDER — FENTANYL CITRATE 0.05 MG/ML IJ SOLN
INTRAMUSCULAR | Status: DC | PRN
Start: 2011-05-04 — End: 2011-05-04
  Administered 2011-05-04 (×2): 50 ug via INTRAVENOUS
  Administered 2011-05-04: 150 ug via INTRAVENOUS
  Administered 2011-05-04 (×5): 50 ug via INTRAVENOUS

## 2011-05-04 MED ORDER — THROMBIN 5000 UNITS EX SOLR: CUTANEOUS | Status: DC | PRN

## 2011-05-04 SURGICAL SUPPLY — 72 items
ADH SKN CLS APL DERMABOND .7 (GAUZE/BANDAGES/DRESSINGS) ×1
BAG DECANTER FOR FLEXI CONT (MISCELLANEOUS) ×2 IMPLANT
BLADE SURG ROTATE 9660 (MISCELLANEOUS) IMPLANT
BONE MATRIX OSTEOCEL PLUS 10CC (Bone Implant) ×2 IMPLANT
BONE MATRIX OSTEOCEL PLUS 5CC (Bone Implant) ×2 IMPLANT
CLOTH BEACON ORANGE TIMEOUT ST (SAFETY) ×4 IMPLANT
CONT SPEC 4OZ CLIKSEAL STRL BL (MISCELLANEOUS) ×2 IMPLANT
COROENT XL 8X22X45 (Orthopedic Implant) ×2 IMPLANT
COROENT XL WIDE LORDO 10X22X45 (Intraocular Lens) ×2 IMPLANT
COVER BACK TABLE 24X17X13 BIG (DRAPES) IMPLANT
COVER TABLE BACK 60X90 (DRAPES) ×2 IMPLANT
DERMABOND ADVANCED (GAUZE/BANDAGES/DRESSINGS) ×1
DERMABOND ADVANCED .7 DNX12 (GAUZE/BANDAGES/DRESSINGS) ×1 IMPLANT
DRAPE C-ARM 42X72 X-RAY (DRAPES) ×4 IMPLANT
DRAPE C-ARMOR (DRAPES) ×4 IMPLANT
DRAPE LAPAROTOMY 100X72X124 (DRAPES) ×4 IMPLANT
DRAPE POUCH INSTRU U-SHP 10X18 (DRAPES) ×4 IMPLANT
DURAPREP 26ML APPLICATOR (WOUND CARE) ×4 IMPLANT
ELECT BLADE 4.0 EZ CLEAN MEGAD (MISCELLANEOUS) ×2
ELECT BLADE 6.5 EXT (BLADE) ×2 IMPLANT
ELECT REM PT RETURN 9FT ADLT (ELECTROSURGICAL) ×4
ELECTRODE BLDE 4.0 EZ CLN MEGD (MISCELLANEOUS) ×1 IMPLANT
ELECTRODE REM PT RTRN 9FT ADLT (ELECTROSURGICAL) ×2 IMPLANT
GAUZE SPONGE 4X4 16PLY XRAY LF (GAUZE/BANDAGES/DRESSINGS) IMPLANT
GLOVE BIO SURGEON STRL SZ7 (GLOVE) ×4 IMPLANT
GLOVE BIOGEL PI IND STRL 7.0 (GLOVE) ×2 IMPLANT
GLOVE BIOGEL PI IND STRL 8.5 (GLOVE) ×3 IMPLANT
GLOVE BIOGEL PI INDICATOR 7.0 (GLOVE) ×2
GLOVE BIOGEL PI INDICATOR 8.5 (GLOVE) ×3
GLOVE ECLIPSE 8.5 STRL (GLOVE) ×4 IMPLANT
GLOVE EXAM NITRILE LRG STRL (GLOVE) IMPLANT
GLOVE EXAM NITRILE MD LF STRL (GLOVE) ×2 IMPLANT
GLOVE EXAM NITRILE XL STR (GLOVE) IMPLANT
GLOVE EXAM NITRILE XS STR PU (GLOVE) IMPLANT
GLOVE SURG SS PI 6.5 STRL IVOR (GLOVE) ×2 IMPLANT
GLOVE SURG SS PI 8.0 STRL IVOR (GLOVE) ×4 IMPLANT
GOWN BRE IMP SLV AUR LG STRL (GOWN DISPOSABLE) IMPLANT
GOWN BRE IMP SLV AUR XL STRL (GOWN DISPOSABLE) ×4 IMPLANT
GOWN STRL REIN 2XL LVL4 (GOWN DISPOSABLE) ×6 IMPLANT
GUIDE WIRE ×12 IMPLANT
IMPLANT COROENT XL 10X22X50MM (Intraocular Lens) ×2 IMPLANT
KIT BASIN OR (CUSTOM PROCEDURE TRAY) ×4 IMPLANT
KIT DILATOR XLIF 5 (KITS) ×1 IMPLANT
KIT MAXCESS (KITS) ×2 IMPLANT
KIT NEEDLE NVM5 EMG ELECT (KITS) ×1 IMPLANT
KIT NEEDLE NVM5 EMG ELECTRODE (KITS) ×1
KIT ROOM TURNOVER OR (KITS) ×2 IMPLANT
KIT XLIF (KITS) ×1
MARKER SKIN DUAL TIP RULER LAB (MISCELLANEOUS) ×4 IMPLANT
NEEDLE HYPO 25X1 1.5 SAFETY (NEEDLE) ×4 IMPLANT
NEEDLE TARGETING ILLICO (NEEDLE) ×4 IMPLANT
NS IRRIG 1000ML POUR BTL (IV SOLUTION) ×4 IMPLANT
PACK LAMINECTOMY NEURO (CUSTOM PROCEDURE TRAY) ×4 IMPLANT
PAD ARMBOARD 7.5X6 YLW CONV (MISCELLANEOUS) ×8 IMPLANT
ROD TI PRECONT ILLICO 5.5X11 (Rod) ×2 IMPLANT
ROD TI STRT ILLICO 5.5X120 (Rod) ×2 IMPLANT
SCREW CANN PA ILLICO 5.5X45 (Screw) ×4 IMPLANT
SCREW CANN PA ILLICO 6.5X45 (Screw) ×4 IMPLANT
SCREW CANNULATED 40MM (Screw) ×4 IMPLANT
SCREW SET SPINAL STD HEXALOBE (Screw) ×12 IMPLANT
SPONGE LAP 4X18 X RAY DECT (DISPOSABLE) IMPLANT
SPONGE SURGIFOAM ABS GEL SZ50 (HEMOSTASIS) ×2 IMPLANT
SUT VIC AB 1 CT1 18XBRD ANBCTR (SUTURE) IMPLANT
SUT VIC AB 1 CT1 8-18 (SUTURE)
SUT VIC AB 2-0 CP2 18 (SUTURE) ×8 IMPLANT
SUT VIC AB 3-0 SH 8-18 (SUTURE) ×10 IMPLANT
SYR 20ML ECCENTRIC (SYRINGE) ×4 IMPLANT
TAPE CLOTH 3X10 TAN LF (GAUZE/BANDAGES/DRESSINGS) ×6 IMPLANT
TOWEL OR 17X24 6PK STRL BLUE (TOWEL DISPOSABLE) ×4 IMPLANT
TOWEL OR 17X26 10 PK STRL BLUE (TOWEL DISPOSABLE) ×4 IMPLANT
TRAY FOLEY CATH 14FRSI W/METER (CATHETERS) ×2 IMPLANT
WATER STERILE IRR 1000ML POUR (IV SOLUTION) ×4 IMPLANT

## 2011-05-04 NOTE — OR Nursing (Signed)
1st part finished at 1651.

## 2011-05-04 NOTE — Preoperative (Signed)
Beta Blockers   Reason not to administer Beta Blockers:Not Applicable 

## 2011-05-04 NOTE — Anesthesia Preprocedure Evaluation (Addendum)
Anesthesia Evaluation  Patient identified by MRN, date of birth, ID band Patient awake    Reviewed: Allergy & Precautions, H&P , NPO status , Patient's Chart, lab work & pertinent test results  Airway Mallampati: II      Dental  (+) Teeth Intact   Pulmonary neg pulmonary ROS,  breath sounds clear to auscultation        Cardiovascular negative cardio ROS  Rhythm:Regular Rate:Normal     Neuro/Psych Anxiety Depression negative neurological ROS     GI/Hepatic negative GI ROS, Neg liver ROS,   Endo/Other  Hypothyroidism   Renal/GU negative Renal ROS     Musculoskeletal negative musculoskeletal ROS (+)   Abdominal   Peds  Hematology negative hematology ROS (+)   Anesthesia Other Findings   Reproductive/Obstetrics                         Anesthesia Physical Anesthesia Plan  ASA: II  Anesthesia Plan: General   Post-op Pain Management:    Induction: Intravenous  Airway Management Planned: Oral ETT  Additional Equipment:   Intra-op Plan:   Post-operative Plan: Extubation in OR  Informed Consent: I have reviewed the patients History and Physical, chart, labs and discussed the procedure including the risks, benefits and alternatives for the proposed anesthesia with the patient or authorized representative who has indicated his/her understanding and acceptance.     Plan Discussed with: CRNA  Anesthesia Plan Comments:         Anesthesia Quick Evaluation

## 2011-05-04 NOTE — Anesthesia Procedure Notes (Signed)
Procedure Name: Intubation Date/Time: 05/04/2011 1:15 PM Performed by: Sharlene Dory E Pre-anesthesia Checklist: Patient identified, Emergency Drugs available, Suction available, Patient being monitored and Timeout performed Patient Re-evaluated:Patient Re-evaluated prior to inductionOxygen Delivery Method: Circle system utilized Preoxygenation: Pre-oxygenation with 100% oxygen Intubation Type: IV induction Ventilation: Mask ventilation without difficulty Laryngoscope Size: Mac and 3 Grade View: Grade I Tube type: Oral Tube size: 7.5 mm Number of attempts: 1 Airway Equipment and Method: Stylet Placement Confirmation: ETT inserted through vocal cords under direct vision,  positive ETCO2 and breath sounds checked- equal and bilateral Secured at: 21 cm Tube secured with: Tape Dental Injury: Teeth and Oropharynx as per pre-operative assessment and Injury to lip

## 2011-05-04 NOTE — Transfer of Care (Signed)
Immediate Anesthesia Transfer of Care Note  Patient: Kerry Ramsey  Procedure(s) Performed: Procedure(s) (LRB): ANTERIOR LATERAL LUMBAR FUSION 3 LEVELS (N/A) LUMBAR PERCUTANEOUS PEDICLE SCREW 3 LEVEL (N/A)  Patient Location: PACU  Anesthesia Type: General  Level of Consciousness: awake, alert , oriented and patient cooperative  Airway & Oxygen Therapy: Patient Spontanous Breathing and Patient connected to nasal cannula oxygen  Post-op Assessment: Report given to PACU RN, Post -op Vital signs reviewed and stable and Patient moving all extremities X 4  Post vital signs: Reviewed and stable  Complications: No apparent anesthesia complications

## 2011-05-04 NOTE — Op Note (Signed)
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 Reconstruction with peek spacer and morselized allograft L2-3 L3-4 L4-5. 3. Posterior fixation segmentally with pedicle screws from L2-L5 percutaneously placed. #4. Electromyographic neural monitoring. Surgeon: Barnett Abu M.D. Assistant: Wyn Quaker M.D. Anesthesia: Gen. endotracheal  Indications: Kerry Ramsey is a 71 year old individual who has had significant back and right lower extremity pain she has evidence of advanced degenerative changes with scoliosis at L2-3 L3-4 L4-5 is marked spondylosis causing foraminal stenosis or nerve root compromise on the right side. She's been advised regarding the need for surgery using an anterolateral technique to decompress and stabilize the spine.  Procedure: Patient was brought to the operating room supine on a stretcher after the smooth induction of general endotracheal anesthesia the patient had electrodes placed in the major muscle groups of the lower extremities including the quadriceps biceps femoris the gluteus the tibialis anterior in the gastrocnemius. Monitoring was conducted during the entire procedure of the anterolateral decompression. There was notable hyperactivity of multiple myotomes during the procedure despite deepening anesthesia this may certainly monitoring difficult to interpret.  Patient was then placed in the right lateral decubitus position and radiographic confirmation was obtained for finality of the spine starting at the first level of L4-L5 and this was verified the patient was taped into position and the table was broken the appropriate amount. Fluoroscopic guidance was used to choose entry sites for the L4-5 the L3-4 and L2-3 sites. A posterior incision was created to enter the  retroperitoneal space and then by placing a finger into the retroperitoneal cavity of the lateral incisions could be connected. A cannula was then placed over the interspace of L4-L5 and its position was confirmed radiographically the K wire that was placed through the cannula into the disc space at L4-L5. A series of dilators were placed using electromyographic monitoring to make sure that no traversing nerve roots were nearby ultimately this was dilated to a 14 mm size. A dilating retractor was then used to uncover the interspace from the lateral aspect. Patient was placed into the disc space and this secured the retractor in place along with the lateral arm area and through this aperture the disc space was opened using a 15 blade and a combination of curettes and rongeurs were used to evacuate a moderate amount of severely degenerated and desiccated disc material a series of dilators were then passed to start the 16 mm paddle size and dilate this to a 10 mm size. The endplates were cleaned using a combination of curettes rongeurs and respiratory this space was evacuated totally and fluoroscopic guidance was used to check that the implant  would be placed appropriately. At L4-5-1 10 mm x 22 mm x 15 mm wide implant was chosen this was filled with morselized allograft and placed into the interspace using a series so paddles to protect the endplates. The paddles were then removed. Fluoroscopic verification of positioning was obtained. This was felt to be appropriate we then continued to L3-L4 were similar procedure was carried out. At L3-L4 the interspace was quite severely sclerotic this series of dilating tools and osteophyte removal tools were used to enter the disc space and then the disc space only dilated to 8 mm size at this level 8 mm x 22 mm x 45 mm wide peek spacer was used and this was placed and countersunk appropriately using  fluoroscopic guidance again this positioning was verified and attention was  turned to L2 L3 a third separate incision the dilating retractor was placed over the interspace of L2-3 a discectomy was completed the endplates were decorticated and here a 10 x 22 x 45 mm size peek spacer was placed into the interspace via indirect decompression for the nerve roots. In the end final radiographs identified good position of all implants and reduction up with US of the scoliosis at least a partial extent. The wounds were then irrigated copiously with antibiotic irrigating solution and closed with 3-0 Vicryl in inverted interrupted fashion. Patient was then turned prone.  After reprepping and redraping fluoroscopic guidance was used to localize pedicle entry sites at L2 bilaterally at L3 on the left L4 on the right and L5 bilaterally. Root individual stab incisions created with a #10 blade and Sheedy needles were used to pass through the pedicle into the vertebral body then a series of dilators was passed over small incision through the skin and pedicle screws were placed in this fashion percutaneously area and at L2-5 0.5 x 45 mm screws were placed at L3 and L4-5 0.5 x 40 mm screws were used at L5 6.5 x 45 mm screws were placed once the screws were individually tapped and placed at each level rods were placed on the left side at precontoured 110 mm rod fit nicely between the screw heads on the right side 120 mm straight rod was contoured to the appropriate curvature to fit between the screw heads this was placed and secured. Final positioning was checked with fluoroscopic guidance when this was verified the incisions were were then closed after irrigating copiously with antibiotic hearing solution. Blood loss for the entire procedure was estimated at approximately 150 cc patient was returned to the recovery room in stable condition.

## 2011-05-04 NOTE — Anesthesia Postprocedure Evaluation (Signed)
Anesthesia Post Note  Patient: Kerry Ramsey  Procedure(s) Performed: Procedure(s) (LRB): ANTERIOR LATERAL LUMBAR FUSION 3 LEVELS (N/A) LUMBAR PERCUTANEOUS PEDICLE SCREW 3 LEVEL (N/A)  Anesthesia type: general  Patient location: PACU  Post pain: Pain level controlled  Post assessment: Patient's Cardiovascular Status Stable  Last Vitals:  Filed Vitals:   05/04/11 2000  BP: 120/73  Pulse: 102  Temp:   Resp: 19    Post vital signs: Reviewed and stable  Level of consciousness: sedated  Complications: No apparent anesthesia complications

## 2011-05-05 MED ORDER — KETOROLAC TROMETHAMINE 15 MG/ML IJ SOLN
15.0000 mg | Freq: Four times a day (QID) | INTRAMUSCULAR | Status: AC
  Administered 2011-05-05 – 2011-05-06 (×4): 15 mg via INTRAVENOUS
  Filled 2011-05-05 (×5): qty 1

## 2011-05-05 MED ORDER — PAROXETINE HCL 30 MG PO TABS
30.0000 mg | ORAL_TABLET | Freq: Every day | ORAL | Status: DC
  Administered 2011-05-05 – 2011-05-08 (×4): 30 mg via ORAL
  Filled 2011-05-05 (×5): qty 1

## 2011-05-05 NOTE — Evaluation (Signed)
Agree with student PT evaluation.  Maysoon Lozada, PT DPT [phone]  

## 2011-05-05 NOTE — Progress Notes (Signed)
OT Cancellation Note  Treatment cancelled today due to:  Pt currently without back brace for OT evaluation. OT attempting in AM and PM with no brace present in room. OT to re attempt 05/06/11.    Harrel Carina [place]   OTR/L Pager: [phone] Office: [phone] .

## 2011-05-05 NOTE — Progress Notes (Signed)
Subjective: Patient reports Complains of soreness throughout back and lower extremity. Comfort level moderate.  Objective: Vital signs in last 24 hours: Temp:  [97.2 F (36.2 C)-98.4 F (36.9 C)] 97.6 F (36.4 C) (04/02 0800) Pulse Rate:  [71-126] 71  (04/02 1000) Resp:  [5-29] 15  (04/02 1000) BP: (94-146)/(45-73) 117/52 mmHg (04/02 1000) SpO2:  [94 %-100 %] 99 % (04/02 1000) Weight:  [73.3 kg (161 lb 9.6 oz)-77.2 kg (170 lb 3.1 oz)] 77.2 kg (170 lb 3.1 oz) (04/01 2348)  Intake/Output from previous day: 04/01 0701 - 04/02 0700 In: 4325 [I.V.:4325] Out: 1195 [Urine:945; Blood:250] Intake/Output this shift: Total I/O In: 225 [I.V.:225] Out: 295 [Urine:295]  Incisions clean and dry. Motor function good in iliopsoas quadriceps tibialis anterior and gastrocs.  Lab Results: No results found for this basename: WBC:2,HGB:2,HCT:2,PLT:2 in the last 72 hours BMET No results found for this basename: NA:2,K:2,CL:2,CO2:2,GLUCOSE:2,BUN:2,CREATININE:2,CALCIUM:2 in the last 72 hours  Studies/Results: Dg Lumbar Spine 2-3 Views  05/04/2011  *RADIOLOGY REPORT*  Clinical Data: L2-L5 discectomy and fusion  LUMBAR SPINE - 2-3 VIEW  Comparison: 03/05/2011  Findings: Two C-arm images show lateral discectomy at L2-3, L3-4 and L4-5.  Interbody material appears well positioned.  There is pedicle screw and posterior rod fixation with bilateral screws at L2 and L5, left sided screw at L3 and right-sided screw at L4.  IMPRESSION: Lateral discectomy and posterior fusion from L2-L5.  Original Report Authenticated By: Thomasenia Sales, M.D.    Assessment/Plan: Day 1 postop 3 level anterolateral decompression and fusion. In addition stable  LOS: 1 day  Mobilized with use of brace and transfer to floor.   Riyanna Crutchley J 05/05/2011, 10:20 AM

## 2011-05-05 NOTE — Progress Notes (Signed)
UR COMPLETED  

## 2011-05-05 NOTE — Evaluation (Signed)
Physical Therapy Evaluation Patient Details Name: Kerry Ramsey MRN: [id number] DOB: 30-Nov-1940 Today's Date: 05/05/2011  Problem List: There is no problem list on file for this patient.   Past Medical History:  Past Medical History  Diagnosis Date  . Hypothyroidism     treatment for Graves disease  . Depression   . Anxiety     panic attack many yrs. ago   . Arthritis     OA- hands & knees, lumbar stenosis, radiculopathy   . Renal calculi     x3 episodes    Past Surgical History:  Past Surgical History  Procedure Date  . Back surgery     2009, diskectomy lumbar   . Hemorroidectomy     45 yrs. ago  . Tonsillectomy     as a child   . Eye surgery     cataracts removed bilateral - /w IOL     PT Assessment/Plan/Recommendation PT Assessment Clinical Impression Statement: Pt is a 71 y.o. female s/p a L2-L3, L3-L4, and L4-L5 decompression with resulting pain, impaired gait, and decreased mobility.  Pt will benefit from skilled physical therapy in the acute setting to address this impairments. PT Recommendation/Assessment: Patient will need skilled PT in the acute care venue PT Problem List: Decreased activity tolerance;Decreased balance;Decreased mobility;Decreased knowledge of use of DME;Decreased knowledge of precautions;Pain Barriers to Discharge: None PT Therapy Diagnosis : Difficulty walking;Abnormality of gait;Acute pain PT Plan PT Frequency: Min 5X/week PT Treatment/Interventions: DME instruction;Gait training;Stair training;Functional mobility training;Therapeutic activities;Balance training;Neuromuscular re-education;Patient/family education PT Recommendation Follow Up Recommendations: Home health PT Equipment Recommended: Rolling walker with 5" wheels;Tub/shower seat PT Goals  Acute Rehab PT Goals PT Goal Formulation: With patient Time For Goal Achievement: 7 days Pt will Roll Supine to Left Side: with modified independence;with rail PT Goal: Rolling Supine  to Left Side - Progress: Goal set today Pt will go Supine/Side to Sit: with modified independence;with rail PT Goal: Supine/Side to Sit - Progress: Goal set today Pt will go Sit to Supine/Side: with modified independence;with rail PT Goal: Sit to Supine/Side - Progress: Goal set today Pt will go Sit to Stand: with supervision;with upper extremity assist PT Goal: Sit to Stand - Progress: Goal set today Pt will go Stand to Sit: with supervision;with upper extremity assist PT Goal: Stand to Sit - Progress: Goal set today Pt will Ambulate: 51 - 150 feet;with supervision;with least restrictive assistive device PT Goal: Ambulate - Progress: Goal set today Pt will Go Up / Down Stairs: 3-5 stairs;with supervision;with least restrictive assistive device PT Goal: Up/Down Stairs - Progress: Goal set today Additional Goals Additional Goal #1: Pt will be able to demonstrate and describe 3/3 back precautions. PT Goal: Additional Goal #1 - Progress: Goal set today  PT Evaluation Precautions/Restrictions  Precautions Precautions: Back Precaution Booklet Issued: Yes (comment) Precaution Comments: Reviewed back precautions with patient. Required Braces or Orthoses: Yes Spinal Brace: Lumbar corset;Applied in sitting position Restrictions Weight Bearing Restrictions: No Prior Functioning  Home Living Lives With: Spouse Receives Help From: Family Type of Home: Other (Comment) (Condo) Home Layout: Able to live on main level with bedroom/bathroom Home Access: Stairs to enter Entrance Stairs-Rails: None Entrance Stairs-Number of Steps: 3 Bathroom Shower/Tub: Psychologist, counselling;Door [employer] Toilet: Handicapped height Bathroom Accessibility: Yes How Accessible: Accessible via walker Home Adaptive Equipment: Grab bars in shower Prior Function Level of Independence: Independent with basic ADLs;Independent with homemaking with ambulation;Independent with gait;Independent with transfers Able to Take  Stairs?: Yes Driving: Yes Vocation: Full time employment  Vocation Requirements: IV nurse Cognition Cognition Arousal/Alertness: Awake/alert Overall Cognitive Status: Appears within functional limits for tasks assessed Orientation Level: Oriented X4 Sensation/Coordination Sensation Light Touch: Appears Intact Coordination Gross Motor Movements are Fluid and Coordinated: Yes Fine Motor Movements are Fluid and Coordinated: Yes Extremity Assessment   Mobility (including Balance) Bed Mobility Bed Mobility: Yes Rolling Left: Other (comment);With rail (Min guard) Rolling Left Details (indicate cue type and reason): Pt required mni guard to initiate rolling and verbal cuing to maintain back precautions. Left Sidelying to Sit: 4: Min assist;With rails Left Sidelying to Sit Details (indicate cue type and reason): Pt required min assist in order to carry out transition from side-lying to sitting at EOB. Sitting - Scoot to Edge of Bed: 6: Modified independent (Device/Increase time);With rail Transfers Transfers: Yes Sit to Stand: 3: Mod assist;From bed;From chair/3-in-1;With upper extremity assist Sit to Stand Details (indicate cue type and reason): Pt requires mod assist in order to initiate transfer with verbal cues for hand placement with RW. Stand to Sit: 4: Min assist;With upper extremity assist;With armrests;To chair/3-in-1 Stand to Sit Details: Pt required min assist and UE assist to control descent into chair with verbal cues for hand placement with RW. Ambulation/Gait Ambulation/Gait: Yes Ambulation/Gait Assistance: 4: Min assist Ambulation/Gait Assistance Details (indicate cue type and reason): Pt required min assist to maintain balance and for safety. Ambulation Distance (Feet): 25 Feet Assistive device: Rolling walker Gait Pattern: Step-to pattern;Decreased stride length Gait velocity: Decreased Stairs: No Wheelchair Mobility Wheelchair Mobility: No  Posture/Postural  Control Posture/Postural Control: No significant limitations Balance Balance Assessed: Yes Static Sitting Balance Static Sitting - Balance Support: Feet supported;Bilateral upper extremity supported Static Sitting - Level of Assistance: 5: Stand by assistance Static Sitting - Comment/# of Minutes: 5 minutes Exercise    End of Session PT - End of Session Equipment Utilized During Treatment: Gait belt;Back brace Activity Tolerance: Patient limited by pain Patient left: in chair;with call bell in reach Nurse Communication: Mobility status for transfers;Mobility status for ambulation General Behavior During Session: [facility] for tasks performed Cognition: [facility] for tasks performed  Ezzard Standing SPT 05/05/2011, 3:19 PM

## 2011-05-06 ENCOUNTER — Encounter (HOSPITAL_COMMUNITY): Payer: Self-pay | Admitting: Neurological Surgery

## 2011-05-06 MED ORDER — PNEUMOCOCCAL VAC POLYVALENT 25 MCG/0.5ML IJ INJ
0.5000 mL | INJECTION | INTRAMUSCULAR | Status: AC
  Filled 2011-05-06: qty 0.5

## 2011-05-06 NOTE — Progress Notes (Signed)
Agree with student PT treatment note. Charmane Protzman, PT DPT [phone]  

## 2011-05-06 NOTE — Progress Notes (Signed)
OT Discharge Note  Patient is being discharged from OT services secondary to:    Goals met and no further therapy needs identified.  Please see latest Therapy Progress Note for current level of functioning and progress toward goals.  Progress and discharge plan and discussed with patient/caregiver and they    Agree    Johnston, Jahnae Mcadoo Brynn   OTR/L Pager: [phone] Office: [phone] .     

## 2011-05-06 NOTE — Progress Notes (Signed)
Subjective: Patient reports Reports generalized fatigue and achiness throughout back and lower extremity  Objective: Vital signs in last 24 hours: Temp:  [97.8 F (36.6 C)-98.5 F (36.9 C)] 98.2 F (36.8 C) (04/03 1800) Pulse Rate:  [74-81] 76  (04/03 1800) Resp:  [17-20] 18  (04/03 1800) BP: (100-117)/(57-70) 117/69 mmHg (04/03 1800) SpO2:  [90 %-95 %] 91 % (04/03 1800) Weight:  [80.7 kg (177 lb 14.6 oz)] 80.7 kg (177 lb 14.6 oz) (04/03 0603)  Intake/Output from previous day: 04/02 0701 - 04/03 0700 In: 585 [P.O.:360; I.V.:225] Out: 320 [Urine:320] Intake/Output this shift:    Incisions remain clean and dry motor function is good in major groups of lower extremities.  Lab Results: No results found for this basename: WBC:2,HGB:2,HCT:2,PLT:2 in the last 72 hours BMET No results found for this basename: NA:2,K:2,CL:2,CO2:2,GLUCOSE:2,BUN:2,CREATININE:2,CALCIUM:2 in the last 72 hours  Studies/Results: No results found.  Assessment/Plan: Stable postop day 2  LOS: 2 days  Continue ambulatory status discharge home when ready   Kaiana Marion J 05/06/2011, 7:26 PM

## 2011-05-06 NOTE — Evaluation (Signed)
Occupational Therapy Evaluation Patient Details Name: SYNA GAD MRN: [id number] DOB: 1940-11-16 Today's Date: 05/06/2011  Problem List: There is no problem list on file for this patient.   Past Medical History:  Past Medical History  Diagnosis Date  . Hypothyroidism     treatment for Graves disease  . Depression   . Anxiety     panic attack many yrs. ago   . Arthritis     OA- hands & knees, lumbar stenosis, radiculopathy   . Renal calculi     x3 episodes    Past Surgical History:  Past Surgical History  Procedure Date  . Back surgery     2009, diskectomy lumbar   . Hemorroidectomy     45 yrs. ago  . Tonsillectomy     as a child   . Eye surgery     cataracts removed bilateral - /w IOL   . Anterior lat lumbar fusion 05/04/2011    Procedure: ANTERIOR LATERAL LUMBAR FUSION 3 LEVELS;  Surgeon: Barnett Abu, MD;  Location: [facility];  Service: Neurosurgery;  Laterality: N/A;  Lumbar Two-Three, Lumbar Three-Four, Lumbar Four-Five Anterolateral decompression/arthrodesis.    OT Assessment/Plan/Recommendation OT Assessment Clinical Impression Statement: 71 yo female s/p fusion L2-5 is at adequate level for d/c home. Recommend Shower seat and RW for d/c. OT to sign off OT Recommendation/Assessment: Patient does not need any further OT services OT Recommendation Follow Up Recommendations: No OT follow up Equipment Recommended: Rolling walker with 5" wheels;Tub/shower seat OT Goals    OT Evaluation Precautions/Restrictions  Precautions Precautions: Back Precaution Booklet Issued: Yes (comment) Precaution Comments: Reviewed back precautions with patient. Required Braces or Orthoses: Yes Spinal Brace: Lumbar corset;Applied in sitting position Restrictions Weight Bearing Restrictions: No Prior Functioning Home Living Lives With: Spouse Receives Help From: Family Type of Home: Other (Comment) (condo) Home Layout: Able to live on main level with  bedroom/bathroom Home Access: Stairs to enter Entrance Stairs-Rails: None Entrance Stairs-Number of Steps: 3 Bathroom Shower/Tub: Psychologist, counselling;Door [employer] Toilet: Handicapped height Bathroom Accessibility: Yes How Accessible: Accessible via walker Home Adaptive Equipment: Grab bars in shower Prior Function Level of Independence: Independent with basic ADLs;Independent with homemaking with ambulation;Independent with gait;Independent with transfers Able to Take Stairs?: Yes Driving: Yes Vocation: Full time employment Vocation Requirements: IV nurse ADL ADL Eating/Feeding: Performed;Modified independent Where Assessed - Eating/Feeding: Chair Grooming: Performed;Wash/dry hands;Modified independent Where Assessed - Grooming: Standing at sink Where Assessed - Upper Body Dressing: Sitting, bed;Unsupported (able to don brace s/p education) Lower Body Dressing: Performed;Moderate assistance Lower Body Dressing Details (indicate cue type and reason): pt unable to cross Bil LE and pt with husband to (A) at d/c . Declined LB AE education.  Where Assessed - Lower Body Dressing: Sitting, bed;Unsupported Toilet Transfer: Simulated;Modified independent Toilet Transfer Method: Proofreader: Raised toilet seat with arms (or 3-in-1 over toilet) (min v/c for hand placement) Toileting - Clothing Manipulation: Simulated;Supervision/safety Where Assessed - Toileting Clothing Manipulation: Sit to stand from 3-in-1 or toilet Toileting - Hygiene: Simulated;Supervision/safety Where Assessed - Toileting Hygiene: Sit to stand from 3-in-1 or toilet Tub/Shower Transfer: Performed;Supervision/safety Tub/Shower Transfer Method: Science writer: Shower seat with back Equipment Used: Rolling walker Ambulation Related to ADLs: PT ambulating Mod I during session for ADLS. PT to ambulate to restroom with staff assist or husband may (A) ADL Comments: Pt recalled 3  out 3 back precautions. Pt educated on brace positioning and proper fit. Pt 100% recall at end of session. Pt with  back handout in room from PT session. PT at adequate level for d/c Vision/Perception  Vision - History Baseline Vision: Wears glasses all the time Patient Visual Report: No change from baseline Cognition Cognition Arousal/Alertness: Awake/alert Overall Cognitive Status: Appears within functional limits for tasks assessed Orientation Level: Oriented X4 Sensation/Coordination Sensation Light Touch: Appears Intact Coordination Gross Motor Movements are Fluid and Coordinated: Yes Fine Motor Movements are Fluid and Coordinated: Yes Extremity Assessment RUE Assessment RUE Assessment: Within Functional Limits LUE Assessment LUE Assessment: Within Functional Limits Mobility  Bed Mobility Bed Mobility: Yes Rolling Left: 5: Supervision Rolling Left Details (indicate cue type and reason): min guard to initiate rolling and verbal cuing to maintain back precautions. Left Sidelying to Sit: 4: Min assist;HOB flat Left Sidelying to Sit Details (indicate cue type and reason): min assist in order to carry out transition from side-lying to sitting at EOB. Sitting - Scoot to Edge of Bed: 6: Modified independent (Device/Increase time);With rail Transfers Transfers: Yes Sit to Stand: Other (comment) (Min Guard (A)) Sit to Stand Details (indicate cue type and reason): v/c for hand placement Exercises   End of Session OT - End of Session Equipment Utilized During Treatment: Gait belt Activity Tolerance: Patient tolerated treatment well Patient left: in chair;with call bell in reach Nurse Communication: Mobility status for transfers;Mobility status for ambulation General Behavior During Session: [facility] for tasks performed Cognition: [facility] for tasks performed   Lucile Shutters 05/06/2011, 2:53 PM  Pager: [phone]

## 2011-05-06 NOTE — Progress Notes (Signed)
Physical Therapy Treatment Patient Details Name: Kerry Ramsey MRN: [id number] DOB: 09/18/1940 Today's Date: 05/06/2011  PT Assessment/Plan  PT - Assessment/Plan Comments on Treatment Session: Pt was much more alert and awake today.  Pt able to ambulate 60 feet.  Pt must do stairs prior to d/c home. PT Plan: Discharge plan remains appropriate;Frequency remains appropriate PT Frequency: Min 5X/week Follow Up Recommendations: Home health PT Equipment Recommended: Rolling walker with 5" wheels;Tub/shower seat PT Goals  Acute Rehab PT Goals PT Goal Formulation: With patient Time For Goal Achievement: 7 days Pt will go Sit to Supine/Side: with modified independence;with rail PT Goal: Sit to Supine/Side - Progress: Progressing toward goal Pt will go Sit to Stand: with supervision;with upper extremity assist PT Goal: Sit to Stand - Progress: Progressing toward goal Pt will go Stand to Sit: with supervision;with upper extremity assist PT Goal: Stand to Sit - Progress: Progressing toward goal Pt will Ambulate: 51 - 150 feet;with supervision;with least restrictive assistive device PT Goal: Ambulate - Progress: Progressing toward goal Additional Goals Additional Goal #1: Pt will be able to demonstrate and describe 3/3 back precautions. PT Goal: Additional Goal #1 - Progress: Progressing toward goal  PT Treatment Precautions/Restrictions  Precautions Precautions: Back Precaution Booklet Issued: Yes (comment) Precaution Comments: Reviewed back precautions with patient. Required Braces or Orthoses: Yes Spinal Brace: Lumbar corset;Applied in sitting position Restrictions Weight Bearing Restrictions: No Mobility (including Balance) Bed Mobility Bed Mobility: Yes Rolling Right: 5: Supervision Rolling Right Details (indicate cue type and reason): Pt required VC for sequencing to observe back precautions. Rolling Left: 5: Supervision Rolling Left Details (indicate cue type and reason):  min guard to initiate rolling and verbal cuing to maintain back precautions. Left Sidelying to Sit: 4: Min assist;HOB flat Left Sidelying to Sit Details (indicate cue type and reason): min assist in order to carry out transition from side-lying to sitting at EOB. Sitting - Scoot to Edge of Bed: 6: Modified independent (Device/Increase time);With rail Sit to Sidelying Left: 5: Supervision Sit to Sidelying Left Details (indicate cue type and reason): Pt required supervision for safety. Transfers Transfers: Yes Sit to Stand: Other (comment);With upper extremity assist;With armrests;From chair/3-in-1 (Min guard) Sit to Stand Details (indicate cue type and reason): Pt required min guard for safety with VC for hand placement with RW. Stand to Sit: Other (comment);With upper extremity assist;To bed (Min guard) Stand to Sit Details: Pt required min guard for safety with VC for hand placement with RW. Ambulation/Gait Ambulation/Gait: Yes Ambulation/Gait Assistance: 4: Min assist Ambulation/Gait Assistance Details (indicate cue type and reason): Pt required min assist to maintain balance and for safety. Ambulation Distance (Feet): 60 Feet Assistive device: Rolling walker Gait Pattern: Step-to pattern;Decreased stride length Gait velocity: Decreased Stairs: No Wheelchair Mobility Wheelchair Mobility: No  Posture/Postural Control Posture/Postural Control: No significant limitations Balance Balance Assessed: No Exercise    End of Session PT - End of Session Equipment Utilized During Treatment: Gait belt;Back brace Activity Tolerance: Patient limited by pain;Patient limited by fatigue Patient left: in bed;with call bell in reach;with family/visitor present;Other (comment) (Nurse present) Nurse Communication: Mobility status for transfers;Mobility status for ambulation General Behavior During Session: [facility] for tasks performed Cognition: [facility] for tasks performed  Ezzard Standing SPT 05/06/2011,  3:22 PM

## 2011-05-07 ENCOUNTER — Inpatient Hospital Stay (HOSPITAL_COMMUNITY): Payer: Medicare Other

## 2011-05-07 MED ORDER — HYDROMORPHONE HCL 2 MG PO TABS
2.0000 mg | ORAL_TABLET | ORAL | Status: DC | PRN
  Administered 2011-05-07 – 2011-05-08 (×6): 2 mg via ORAL
  Filled 2011-05-07 (×6): qty 1

## 2011-05-07 NOTE — Progress Notes (Signed)
Agree with student PT treatment note. Ozil Stettler, PT DPT [phone]  

## 2011-05-07 NOTE — Progress Notes (Signed)
Subjective: Patient reports complains of back pain  Objective: Vital signs in last 24 hours: Temp:  [97.8 F (36.6 C)-98.4 F (36.9 C)] 98.4 F (36.9 C) (04/04 2137) Pulse Rate:  [77-86] 77  (04/04 2137) Resp:  [16-20] 19  (04/04 2137) BP: (114-136)/(56-74) 136/56 mmHg (04/04 2137) SpO2:  [94 %-95 %] 95 % (04/04 2137)  Intake/Output from previous day: 04/03 0701 - 04/04 0700 In: 635 [P.O.:635] Out: 400 [Urine:400] Intake/Output this shift:    motor function intact , incisions clean n dry.  Lab Results: No results found for this basename: WBC:2,HGB:2,HCT:2,PLT:2 in the last 72 hours BMET No results found for this basename: NA:2,K:2,CL:2,CO2:2,GLUCOSE:2,BUN:2,CREATININE:2,CALCIUM:2 in the last 72 hours  Studies/Results: Ct Lumbar Spine Wo Contrast  05/07/2011  *RADIOLOGY REPORT*  Clinical Data: 71 year old female status post spine surgery on 05/04/2011.  Postop hardware evaluation.  CT LUMBAR SPINE WITHOUT CONTRAST  Technique:  Multidetector CT imaging of the lumbar spine was performed without intravenous contrast administration. Multiplanar CT image reconstructions were also generated.  Comparison: Intraoperative radiographs 05/04/2011.  Preoperative CT lumbar myelogram 03/05/2011.  Findings: The visualized abdominal viscera including the kidneys appear stable.  There is retroperitoneal stranding which is slightly worse on the left and extends from the L2-L3 level through the L4-L5 level.  There is mild enlargement of the left psoas muscle with stranding and its surface and intermittent gas locules within its substance (series 5 image 47).  The right psoas has a more normal appearance.  Posteriorly there are postoperative changes to the paraspinal soft tissues including some small foci of gas and midline stranding.  Interval postoperative changes from the L2 to the L5 level.  Stable height and alignment of the T12 and L1 levels.  Stable visualized sacrum and SI joints.  L2-L3:   Transpedicular and interbody fusion hardware placed.  The right L2 pedicle  screw traverses the right L2 transverse process with associated mild displacement of the transverse process (series 4 image 38).  Interbody implant placement appears adequate.  L3-L4:  Unilateral left L3 and unilateral right L4 transpedicular hardware.  The right L4 screw partially traverses the transverse process.  Interbody implant placement appears adequate. Preexisting endplate irregularity is noted.  L4-L5:  Bilateral L5 transpedicular hardware.  The tip of the left screw extends only slightly beyond the anterior cortex of the L5 vertebral body.  Interbody implant placement appears adequate.  IMPRESSION: 1.  L2-L3 through L4-L5 transpedicular and interbody fusion hardware placed as above. 2.  Postoperative changes to the posterior paraspinal soft tissues. Stranding and mild enlargement of the psoas muscles, greater on the left.  Occasional gas foci within the left psoas presumably are postoperative.  Original Report Authenticated By: Harley Hallmark, M.D.    Assessment/Plan: Stable, will obtain ct of lumbar spine to check hardware placement.  LOS: 3 days  change po meds to dilaudid   Kerry Ramsey 05/07/2011, 10:56 PM

## 2011-05-07 NOTE — Progress Notes (Signed)
Physical Therapy Treatment Patient Details Name: Kerry Ramsey MRN: [id number] DOB: 15-Aug-1940 Today's Date: 05/07/2011  PT Assessment/Plan  PT - Assessment/Plan Comments on Treatment Session: Pt continues to have significant pain, 8-9/10.  Pt able to ambulate down the hall and perform stair training.  Instructed patient and patient's husband in appropriate sequencing and mechanics in order to do hand held assist to ambulate stairs.  Pt able to ambulate 3 steps which is required to enter her house.  Pt ready to d/c home when medically appropriate. PT Plan: Discharge plan remains appropriate;Frequency remains appropriate PT Frequency: Min 5X/week Follow Up Recommendations: Home health PT Equipment Recommended: Rolling walker with 5" wheels;Tub/shower seat PT Goals  Acute Rehab PT Goals PT Goal Formulation: With patient Time For Goal Achievement: 7 days Pt will Roll Supine to Left Side: with modified independence;with rail PT Goal: Rolling Supine to Left Side - Progress: Progressing toward goal Pt will go Supine/Side to Sit: with modified independence;with rail PT Goal: Supine/Side to Sit - Progress: Progressing toward goal Pt will go Sit to Stand: with supervision;with upper extremity assist PT Goal: Sit to Stand - Progress: Progressing toward goal Pt will go Stand to Sit: with supervision;with upper extremity assist PT Goal: Stand to Sit - Progress: Met Pt will Ambulate: 51 - 150 feet;with supervision;with least restrictive assistive device PT Goal: Ambulate - Progress: Progressing toward goal Pt will Go Up / Down Stairs: 3-5 stairs;with supervision;with least restrictive assistive device PT Goal: Up/Down Stairs - Progress: Progressing toward goal Additional Goals Additional Goal #1: Pt will be able to demonstrate and describe 3/3 back precautions. PT Goal: Additional Goal #1 - Progress: Met  PT Treatment Precautions/Restrictions  Precautions Precautions: Back Precaution  Booklet Issued: Yes (comment) Precaution Comments: Pt able to describe 3/3 back precautions. Required Braces or Orthoses: Yes Spinal Brace: Lumbar corset;Applied in sitting position Restrictions Weight Bearing Restrictions: No Mobility (including Balance) Bed Mobility Bed Mobility: Yes Rolling Left: Other (comment);With rail (Min guard) Rolling Left Details (indicate cue type and reason): Pt required min guard to initiate rolling. Left Sidelying to Sit: 5: Supervision;With rails;HOB elevated (comment degrees) (Min assist) Left Sidelying to Sit Details (indicate cue type and reason): Supervision to maintain back precautions. Sitting - Scoot to Edge of Bed: 7: Independent Transfers Transfers: Yes Sit to Stand: Other (comment);With upper extremity assist;From bed (Min guard) Sit to Stand Details (indicate cue type and reason): Pt required min guard for safety with VC for hand placement with RW. Stand to Sit: 5: Supervision;With upper extremity assist;With armrests;To chair/3-in-1 Stand to Sit Details: Pt required supervision for safety.  Pt performed hand placement with RW correctly w/o VC, Ambulation/Gait Ambulation/Gait: Yes Ambulation/Gait Assistance: Other (comment) (Min guard) Ambulation/Gait Assistance Details (indicate cue type and reason): Pt required min guard fro safety. Ambulation Distance (Feet): 100 Feet Assistive device: Rolling walker Gait Pattern: Step-through pattern;Decreased stride length Gait velocity: Decreased Stairs: Yes Stairs Assistance: 4: Min assist Stairs Assistance Details (indicate cue type and reason): Pt required VC for sequencing.  Pt required hand held assist to ascend and descend the steps. Stair Management Technique: One rail Right;Other (comment) (Hand held assist) Number of Stairs: 3  Wheelchair Mobility Wheelchair Mobility: No  Posture/Postural Control Posture/Postural Control: No significant limitations Balance Balance Assessed: Yes Static  Sitting Balance Static Sitting - Balance Support: Feet supported;Bilateral upper extremity supported Static Sitting - Level of Assistance: 6: Modified independent (Device/Increase time) Static Sitting - Comment/# of Minutes: 5 minutes Static Standing Balance Static Standing - Balance  Support: No upper extremity supported;During functional activity Static Standing - Level of Assistance: 5: Stand by assistance Static Standing - Comment/# of Minutes: 5 minutes to wash hands at sink Exercise    End of Session PT - End of Session Equipment Utilized During Treatment: Gait belt;Back brace Activity Tolerance: Patient tolerated treatment well;Patient limited by pain Patient left: in chair;with call bell in reach;with family/visitor present Nurse Communication: Mobility status for transfers;Mobility status for ambulation General Behavior During Session: [facility] for tasks performed Cognition: [facility] for tasks performed  Ezzard Standing SPT 05/07/2011, 12:03 PM

## 2011-05-08 MED ORDER — OXYCODONE-ACETAMINOPHEN 5-325 MG PO TABS
1.0000 | ORAL_TABLET | ORAL | Status: DC | PRN
  Administered 2011-05-08 – 2011-05-09 (×4): 2 via ORAL
  Filled 2011-05-08 (×4): qty 2

## 2011-05-08 MED ORDER — SENNOSIDES-DOCUSATE SODIUM 8.6-50 MG PO TABS
2.0000 | ORAL_TABLET | Freq: Once | ORAL | Status: AC
  Administered 2011-05-08: 2 via ORAL
  Filled 2011-05-08: qty 2

## 2011-05-08 MED ORDER — NAPROXEN 375 MG PO TABS
375.0000 mg | ORAL_TABLET | Freq: Two times a day (BID) | ORAL | Status: DC
  Administered 2011-05-08 – 2011-05-09 (×2): 375 mg via ORAL
  Filled 2011-05-08 (×5): qty 1

## 2011-05-08 MED ORDER — NAPROXEN 375 MG PO TABS: 375.0000 mg | ORAL_TABLET | Freq: Two times a day (BID) | ORAL | Status: DC

## 2011-05-08 MED ORDER — FLEET ENEMA 7-19 GM/118ML RE ENEM
1.0000 | ENEMA | Freq: Every day | RECTAL | Status: DC | PRN
  Administered 2011-05-08: 1 via RECTAL
  Filled 2011-05-08: qty 1

## 2011-05-08 MED ORDER — OXYCODONE-ACETAMINOPHEN 5-325 MG PO TABS: 1.0000 | ORAL_TABLET | ORAL | Status: AC | PRN

## 2011-05-08 NOTE — Discharge Summary (Signed)
Physician Discharge Summary  Patient ID: Kerry Ramsey MRN: [id number] DOB/AGE: Apr 05, 1940 71 y.o.  Admit date: 05/04/2011 Discharge date: 05/08/2011  Admission Diagnoses: Lumbar scoliosis, lumbar spondylosis, lumbar radiculopathy  Discharge Diagnoses: Lumbar scoliosis, lumbar spondylosis, lumbar radiculopathy Active Problems:  * No active hospital problems. *    Discharged Condition: good  Hospital Course: Patient was admitted to undergo surgical decompression of her scoliosis from L2-L5 she tolerated surgery well radicular pain has been improved however her back is rather sore. She is ambulating stably. She did have some postoperative constipation that is improving.  Consults: None  Significant Diagnostic Studies: Myelogram and postmyelogram CT scan  Treatments: surgery: Anterolateral decompression L2-3 L3-4 L4-5 using peek spacers and allograft posterior percutaneous fixation L2-L5 with pedicle screws.  Discharge Exam: Blood pressure 139/58, pulse 84, temperature 98.6 F (37 C), temperature source Oral, resp. rate 18, height 5\' 6"  (1.676 m), weight 80.7 kg (177 lb 14.6 oz), SpO2 95.00%. Incisions are clean and dry motor function is good in iliopsoas quadriceps tibialis anterior and gastrocs.  Disposition:  discharge home  Discharge Orders    Future Orders Please Complete By Expires   Diet - low sodium heart healthy      Increase activity slowly      Discharge instructions      Comments:   Sit straight walk straight stand straight. Mind your posture. Okay to shower. No lifting greater than 10 pounds. Walk frequently as tolerated.   Call MD for:  temperature >100.4      Call MD for:  severe uncontrolled pain      Call MD for:  redness, tenderness, or signs of infection (pain, swelling, redness, odor or green/yellow discharge around incision site)        Medication List  As of 05/08/2011  3:35 PM   TAKE these medications         calcium elemental as carbonate 400 MG  tablet   Commonly known as: BARIATRIC TUMS ULTRA   Chew 1,000 mg by mouth 2 (two) times daily.      ibuprofen 600 MG tablet   Commonly known as: ADVIL,MOTRIN   Take 600 mg by mouth every 6 (six) hours as needed. For pain      levothyroxine 50 MCG tablet   Commonly known as: SYNTHROID, LEVOTHROID   Take 50 mcg by mouth every other day. Alternate days with synthroid 75 mcg      levothyroxine 75 MCG tablet   Commonly known as: SYNTHROID, LEVOTHROID   Take 75 mcg by mouth every other day. Alternate days with synthroid 50 mcg      naproxen 375 MG tablet   Commonly known as: NAPROSYN   Take 1 tablet (375 mg total) by mouth 2 (two) times daily with a meal.      oxyCODONE-acetaminophen 5-325 MG per tablet   Commonly known as: PERCOCET   Take 1-2 tablets by mouth every 3 (three) hours as needed for pain.      PARoxetine 30 MG tablet   Commonly known as: PAXIL   Take 30 mg by mouth every evening.      SLEEP AID PO   Take 0.5 tablets by mouth at bedtime as needed. For sleep      traZODone 50 MG tablet   Commonly known as: DESYREL   Take 25 mg by mouth at bedtime as needed.           Follow-up Information    Follow up with [name], MD. Schedule  an appointment as soon as possible for a visit in 3 weeks. (Call Aram Beecham for appointment)    Contact information:   [address], [address] [place] [address] [phone]          Signed: [name] 05/08/2011, 3:35 PM

## 2011-05-08 NOTE — Progress Notes (Signed)
   CARE MANAGEMENT NOTE 05/08/2011  Patient:  Kerry Ramsey, Kerry Ramsey   Account Number:  [id number]  Date Initiated:  05/08/2011  Documentation initiated by:  Mohammad Granade  Subjective/Objective Assessment:   PT S/P LUMBAR SURGERY; NEEDS HOME HEALTH FOLLOW UP AND DME     Action/Plan:   MET WITH PT AND HUSBAND TO DISCUSS DC PLANS.  REQUESTS RW AND SHOWER CHAIR FOR HOME.  AGREEABLE TO HHPT AT DC.   Anticipated DC Date:  05/09/2011   Anticipated DC Plan:  HOME W HOME HEALTH SERVICES      DC Planning Services  CM consult      [facility]  HOME HEALTH   Choice offered to / List presented to:  C-1 Patient   DME arranged  Levan Hurst      DME agency  [facility]     HH arranged  HH-2 PT      [facility] agency  [facility]   Status of service:  Completed, signed off Medicare Important Message given?   (If response is "NO", the following Medicare IM given date fields will be blank) Date Medicare IM given:   Date Additional Medicare IM given:    Discharge Disposition:  HOME W HOME HEALTH SERVICES  Per UR Regulation:    If discussed at Long Length of Stay Meetings, dates discussed:    Comments:    Phone #[phone]

## 2011-05-08 NOTE — Progress Notes (Signed)
Physical Therapy Treatment Patient Details Name: Kerry Ramsey MRN: [id number] DOB: 10/04/1940 Today's Date: 05/08/2011  PT Assessment/Plan  PT - Assessment/Plan Comments on Treatment Session: Pt continues to have significant pain, 9-10/10, following pain medication.  Pt able to ambulate 25 feet before stating that the pain was making her nauseaous and she needed to go back to the room.  Would like the patient to be able to demonstrate increased tolerance to ambulation prior to d/c home. PT Plan: Discharge plan remains appropriate;Frequency remains appropriate PT Frequency: Min 5X/week Follow Up Recommendations: Home health PT Equipment Recommended: Rolling walker with 5" wheels;Tub/shower seat PT Goals  Acute Rehab PT Goals PT Goal Formulation: With patient Time For Goal Achievement: 7 days Pt will Roll Supine to Left Side: with modified independence;with rail PT Goal: Rolling Supine to Left Side - Progress: Progressing toward goal Pt will go Supine/Side to Sit: with modified independence;with rail PT Goal: Supine/Side to Sit - Progress: Progressing toward goal Pt will go Sit to Supine/Side: with modified independence;with rail PT Goal: Sit to Supine/Side - Progress: Progressing toward goal Pt will go Sit to Stand: with supervision;with upper extremity assist PT Goal: Sit to Stand - Progress: Progressing toward goal Pt will go Stand to Sit: with supervision;with upper extremity assist PT Goal: Stand to Sit - Progress: Met Pt will Ambulate: 51 - 150 feet;with supervision;with least restrictive assistive device PT Goal: Ambulate - Progress: Progressing toward goal Additional Goals Additional Goal #1: Pt will be able to demonstrate and describe 3/3 back precautions. PT Goal: Additional Goal #1 - Progress: Met  PT Treatment Precautions/Restrictions  Precautions Precautions: Back Precaution Booklet Issued: Yes (comment) Precaution Comments: Pt able to describe 3/3 back  precautions. Required Braces or Orthoses: Yes Spinal Brace: Lumbar corset;Applied in sitting position Restrictions Weight Bearing Restrictions: No Mobility (including Balance) Bed Mobility Bed Mobility: Yes Rolling Right: Other (comment);With rail (Min guard) Rolling Right Details (indicate cue type and reason): Pt required VC for sequencing. Rolling Left: Other (comment);With rail (Min guard) Rolling Left Details (indicate cue type and reason): Pt required min guard to initiate rolling. Left Sidelying to Sit: 5: Supervision;With rails;HOB elevated (comment degrees) Left Sidelying to Sit Details (indicate cue type and reason): Supervision to maintain back precautions and for safety. Sitting - Scoot to Edge of Bed: 7: Independent Sit to Sidelying Left: 5: Supervision Sit to Sidelying Left Details (indicate cue type and reason): Pt required supervision for safety. Transfers Transfers: Yes Sit to Stand: Other (comment);With upper extremity assist;From bed (Min guard) Sit to Stand Details (indicate cue type and reason): Pt required min guard for safety with VC for hand placement with RW. Stand to Sit: 5: Supervision;To bed;With upper extremity assist;With armrests Stand to Sit Details: Pt required supervision for safety. Pt performed hand placement with RW correctly w/o VC. Ambulation/Gait Ambulation/Gait: Yes Ambulation/Gait Assistance: Other (comment) (Min guard) Ambulation/Gait Assistance Details (indicate cue type and reason): Pt required min guard for safety. Ambulation Distance (Feet): 50 Feet Assistive device: Rolling walker Gait Pattern: Step-through pattern;Decreased stride length Gait velocity: Decreased Stairs: No Wheelchair Mobility Wheelchair Mobility: No  Posture/Postural Control Posture/Postural Control: No significant limitations Exercise    End of Session PT - End of Session Equipment Utilized During Treatment: Gait belt;Back brace Activity Tolerance: Patient  limited by pain Patient left: in bed;with call bell in reach General Behavior During Session: [facility] for tasks performed Cognition: [facility] for tasks performed  Ezzard Standing SPT 05/08/2011, 11:53 AM  Jake Shark, PT DPT [phone]

## 2011-05-08 NOTE — Progress Notes (Signed)
Prescription of naproxen and percocet given to patient. Sian

## 2011-05-09 NOTE — Discharge Summary (Addendum)
Pt given written and verbal education on back precautions and activity.  Pt verbalized understanding of d/c instructions.  Incision clean with dermabond intact.  Pt's skin red and irritated around incision probably related to tape.  Pt instructed to get OTC benadryl cream and steroid cream to apply at home.  Script given for Percocet.  Pt d/c'd home via w/c donning brace; accompanied by medical staff and husband.

## 2011-05-09 NOTE — Progress Notes (Signed)
Patient ID: Kerry Ramsey, female   DOB: Mar 23, 1940, 71 y.o.   MRN: [id number] Doing well. Home today as planned. Incisions CDI

## 2011-05-11 DIAGNOSIS — IMO0001 Reserved for inherently not codable concepts without codable children: Secondary | ICD-10-CM | POA: Diagnosis not present

## 2011-05-11 DIAGNOSIS — Z4789 Encounter for other orthopedic aftercare: Secondary | ICD-10-CM | POA: Diagnosis not present

## 2011-05-11 DIAGNOSIS — R269 Unspecified abnormalities of gait and mobility: Secondary | ICD-10-CM | POA: Diagnosis not present

## 2011-05-13 DIAGNOSIS — IMO0001 Reserved for inherently not codable concepts without codable children: Secondary | ICD-10-CM | POA: Diagnosis not present

## 2011-05-13 DIAGNOSIS — R269 Unspecified abnormalities of gait and mobility: Secondary | ICD-10-CM | POA: Diagnosis not present

## 2011-05-13 DIAGNOSIS — Z4789 Encounter for other orthopedic aftercare: Secondary | ICD-10-CM | POA: Diagnosis not present

## 2011-05-15 DIAGNOSIS — R269 Unspecified abnormalities of gait and mobility: Secondary | ICD-10-CM | POA: Diagnosis not present

## 2011-05-15 DIAGNOSIS — Z4789 Encounter for other orthopedic aftercare: Secondary | ICD-10-CM | POA: Diagnosis not present

## 2011-05-15 DIAGNOSIS — IMO0001 Reserved for inherently not codable concepts without codable children: Secondary | ICD-10-CM | POA: Diagnosis not present

## 2011-05-18 DIAGNOSIS — R269 Unspecified abnormalities of gait and mobility: Secondary | ICD-10-CM | POA: Diagnosis not present

## 2011-05-18 DIAGNOSIS — Z4789 Encounter for other orthopedic aftercare: Secondary | ICD-10-CM | POA: Diagnosis not present

## 2011-05-18 DIAGNOSIS — IMO0001 Reserved for inherently not codable concepts without codable children: Secondary | ICD-10-CM | POA: Diagnosis not present

## 2011-05-21 DIAGNOSIS — Z4789 Encounter for other orthopedic aftercare: Secondary | ICD-10-CM | POA: Diagnosis not present

## 2011-05-21 DIAGNOSIS — IMO0001 Reserved for inherently not codable concepts without codable children: Secondary | ICD-10-CM | POA: Diagnosis not present

## 2011-05-21 DIAGNOSIS — R269 Unspecified abnormalities of gait and mobility: Secondary | ICD-10-CM | POA: Diagnosis not present

## 2011-05-28 DIAGNOSIS — M4310 Spondylolisthesis, site unspecified: Secondary | ICD-10-CM | POA: Diagnosis not present

## 2011-07-30 DIAGNOSIS — M4310 Spondylolisthesis, site unspecified: Secondary | ICD-10-CM | POA: Diagnosis not present

## 2011-08-27 DIAGNOSIS — M4310 Spondylolisthesis, site unspecified: Secondary | ICD-10-CM | POA: Diagnosis not present

## 2011-09-22 DIAGNOSIS — M4310 Spondylolisthesis, site unspecified: Secondary | ICD-10-CM | POA: Diagnosis not present

## 2011-10-26 ENCOUNTER — Other Ambulatory Visit: Payer: Self-pay | Admitting: Internal Medicine

## 2011-10-26 DIAGNOSIS — Z1231 Encounter for screening mammogram for malignant neoplasm of breast: Secondary | ICD-10-CM

## 2011-10-27 DIAGNOSIS — M899 Disorder of bone, unspecified: Secondary | ICD-10-CM | POA: Diagnosis not present

## 2011-10-27 DIAGNOSIS — Z23 Encounter for immunization: Secondary | ICD-10-CM | POA: Diagnosis not present

## 2011-10-27 DIAGNOSIS — E039 Hypothyroidism, unspecified: Secondary | ICD-10-CM | POA: Diagnosis not present

## 2011-10-28 DIAGNOSIS — M4310 Spondylolisthesis, site unspecified: Secondary | ICD-10-CM | POA: Diagnosis not present

## 2011-11-03 DIAGNOSIS — J309 Allergic rhinitis, unspecified: Secondary | ICD-10-CM | POA: Diagnosis not present

## 2011-11-03 DIAGNOSIS — J019 Acute sinusitis, unspecified: Secondary | ICD-10-CM | POA: Diagnosis not present

## 2011-11-03 DIAGNOSIS — J3089 Other allergic rhinitis: Secondary | ICD-10-CM | POA: Diagnosis not present

## 2011-11-05 ENCOUNTER — Ambulatory Visit
Admission: RE | Admit: 2011-11-05 | Discharge: 2011-11-05 | Disposition: A | Discharge status: Home / Self Care | Payer: Medicare Other | Source: Ambulatory Visit | Attending: Internal Medicine | Admitting: Internal Medicine

## 2011-11-05 DIAGNOSIS — Z1231 Encounter for screening mammogram for malignant neoplasm of breast: Secondary | ICD-10-CM | POA: Diagnosis not present

## 2011-12-09 DIAGNOSIS — M899 Disorder of bone, unspecified: Secondary | ICD-10-CM | POA: Diagnosis not present

## 2011-12-09 DIAGNOSIS — M949 Disorder of cartilage, unspecified: Secondary | ICD-10-CM | POA: Diagnosis not present

## 2011-12-15 DIAGNOSIS — M545 Low back pain, unspecified: Secondary | ICD-10-CM | POA: Diagnosis not present

## 2011-12-26 DIAGNOSIS — K1120 Sialoadenitis, unspecified: Secondary | ICD-10-CM | POA: Diagnosis not present

## 2011-12-26 DIAGNOSIS — Z79899 Other long term (current) drug therapy: Secondary | ICD-10-CM | POA: Diagnosis not present

## 2012-02-19 ENCOUNTER — Emergency Department (HOSPITAL_COMMUNITY): Payer: Medicare Other

## 2012-02-19 ENCOUNTER — Emergency Department (HOSPITAL_COMMUNITY)
Admission: EM | Admit: 2012-02-19 | Discharge: 2012-02-19 | Disposition: A | Discharge status: Home / Self Care | Payer: Medicare Other | Attending: Emergency Medicine | Admitting: Emergency Medicine

## 2012-02-19 ENCOUNTER — Encounter (HOSPITAL_COMMUNITY): Payer: Self-pay | Admitting: Emergency Medicine

## 2012-02-19 DIAGNOSIS — F3289 Other specified depressive episodes: Secondary | ICD-10-CM | POA: Diagnosis not present

## 2012-02-19 DIAGNOSIS — R51 Headache: Secondary | ICD-10-CM | POA: Diagnosis not present

## 2012-02-19 DIAGNOSIS — F411 Generalized anxiety disorder: Secondary | ICD-10-CM | POA: Insufficient documentation

## 2012-02-19 DIAGNOSIS — Z7982 Long term (current) use of aspirin: Secondary | ICD-10-CM | POA: Insufficient documentation

## 2012-02-19 DIAGNOSIS — Z87891 Personal history of nicotine dependence: Secondary | ICD-10-CM | POA: Insufficient documentation

## 2012-02-19 DIAGNOSIS — IMO0002 Reserved for concepts with insufficient information to code with codable children: Secondary | ICD-10-CM | POA: Insufficient documentation

## 2012-02-19 DIAGNOSIS — Z87442 Personal history of urinary calculi: Secondary | ICD-10-CM | POA: Insufficient documentation

## 2012-02-19 DIAGNOSIS — J3489 Other specified disorders of nose and nasal sinuses: Secondary | ICD-10-CM | POA: Diagnosis not present

## 2012-02-19 DIAGNOSIS — F329 Major depressive disorder, single episode, unspecified: Secondary | ICD-10-CM | POA: Insufficient documentation

## 2012-02-19 DIAGNOSIS — M1990 Unspecified osteoarthritis, unspecified site: Secondary | ICD-10-CM | POA: Insufficient documentation

## 2012-02-19 DIAGNOSIS — Z79899 Other long term (current) drug therapy: Secondary | ICD-10-CM | POA: Diagnosis not present

## 2012-02-19 DIAGNOSIS — R0789 Other chest pain: Secondary | ICD-10-CM | POA: Diagnosis not present

## 2012-02-19 DIAGNOSIS — R0602 Shortness of breath: Secondary | ICD-10-CM | POA: Insufficient documentation

## 2012-02-19 DIAGNOSIS — R079 Chest pain, unspecified: Secondary | ICD-10-CM

## 2012-02-19 DIAGNOSIS — E039 Hypothyroidism, unspecified: Secondary | ICD-10-CM | POA: Diagnosis not present

## 2012-02-19 DIAGNOSIS — M48061 Spinal stenosis, lumbar region without neurogenic claudication: Secondary | ICD-10-CM | POA: Diagnosis not present

## 2012-02-19 LAB — COMPREHENSIVE METABOLIC PANEL
AST: 23 U/L (ref 0–37)
Albumin: 4.3 g/dL (ref 3.5–5.2)
Chloride: 104 mEq/L (ref 96–112)
Creatinine, Ser: 0.5 mg/dL (ref 0.50–1.10)
Total Bilirubin: 0.3 mg/dL (ref 0.3–1.2)

## 2012-02-19 LAB — CBC WITH DIFFERENTIAL/PLATELET
Basophils Absolute: 0 10*3/uL (ref 0.0–0.1)
Basophils Relative: 0 % (ref 0–1)
HCT: 41.1 % (ref 36.0–46.0)
MCHC: 34.5 g/dL (ref 30.0–36.0)
Monocytes Absolute: 0.4 10*3/uL (ref 0.1–1.0)
Neutro Abs: 4.3 10*3/uL (ref 1.7–7.7)
Neutrophils Relative %: 60 % (ref 43–77)
Platelets: 294 10*3/uL (ref 150–400)
RDW: 13.2 % (ref 11.5–15.5)

## 2012-02-19 LAB — POCT I-STAT TROPONIN I: Troponin i, poc: 0 ng/mL (ref 0.00–0.08)

## 2012-02-19 MED ORDER — IOHEXOL 350 MG/ML SOLN
100.0000 mL | Freq: Once | INTRAVENOUS | Status: AC | PRN
  Administered 2012-02-19: 80 mL via INTRAVENOUS

## 2012-02-19 NOTE — ED Provider Notes (Signed)
History     CSN: [id number]  Arrival date & time 02/19/12  1313   First MD Initiated Contact with Patient 02/19/12 1622      Chief Complaint  Patient presents with  . Headache  . Shortness of Breath    (Consider location/radiation/quality/duration/timing/severity/associated sxs/prior treatment) HPI Comments: 72 y/o F p/w HA and CP. Onset about 5-6 hours ago. Onset of HA initially. At rest. Moderate frontal headache. Described as pressure sensation. Not severe. No radiation. No visual changes. Shortly after developed left chest pain. Pressure sensation. No radiation. No numbness/tingling. No SOB or diaphoresis at that time. Mild SOB upon arrival to ED. Quickly resolved. Chest pressure improved after ASA in <5 minutes and resolved in 30 minutes. HA resolved in about 5 minutes after onset. No CP or HA since.  Patient is a 72 y.o. female presenting with headaches and chest pain. The history is provided by the patient and the spouse.  Headache  This is a new problem. The current episode started 3 to 5 hours ago. Episode frequency: once. The problem has been resolved. The headache is associated with nothing. The pain is located in the frontal region. Quality: pressure. The pain is moderate. The pain does not radiate. Associated symptoms include shortness of breath. Pertinent negatives include no fever, no orthopnea, no palpitations, no nausea and no vomiting. She has tried aspirin for the symptoms.  Chest Pain The chest pain began 3 - 5 hours ago. Episode frequency: once. The chest pain is resolved. Associated with: no clear associations. Not worsened with exertion. The severity of the pain is moderate. The quality of the pain is described as pressure-like. The pain does not radiate. Exacerbated by: nothing. Primary symptoms include shortness of breath. Pertinent negatives for primary symptoms include no fever, no cough, no palpitations, no nausea, no vomiting and no dizziness.  Pertinent negatives  for associated symptoms include no diaphoresis, no lower extremity edema, no numbness, no orthopnea and no weakness.     Past Medical History  Diagnosis Date  . Hypothyroidism     treatment for Graves disease  . Depression   . Anxiety     panic attack many yrs. ago   . Arthritis     OA- hands & knees, lumbar stenosis, radiculopathy   . Renal calculi     x3 episodes     Past Surgical History  Procedure Date  . Back surgery     2009, diskectomy lumbar   . Hemorroidectomy     45 yrs. ago  . Tonsillectomy     as a child   . Eye surgery     cataracts removed bilateral - /w IOL   . Anterior lat lumbar fusion 05/04/2011    Procedure: ANTERIOR LATERAL LUMBAR FUSION 3 LEVELS;  Surgeon: Barnett Abu, MD;  Location: [facility];  Service: Neurosurgery;  Laterality: N/A;  Lumbar Two-Three, Lumbar Three-Four, Lumbar Four-Five Anterolateral decompression/arthrodesis.    Family History  Problem Relation Age of Onset  . Anesthesia problems Neg Hx     History  Substance Use Topics  . Smoking status: Former Smoker    Quit date: 04/28/1988  . Smokeless tobacco: Not on file  . Alcohol Use: Yes     Comment: occas. glass of wine     OB History    Grav Para Term Preterm Abortions TAB SAB Ect Mult Living                  Review of Systems  Constitutional:  Negative for fever and diaphoresis.  HENT: Positive for rhinorrhea (chronic. no change). Negative for congestion and neck pain.   Eyes: Negative for photophobia and visual disturbance.  Respiratory: Positive for shortness of breath. Negative for cough.   Cardiovascular: Positive for chest pain. Negative for palpitations and orthopnea.  Gastrointestinal: Negative for nausea and vomiting.  Genitourinary: Negative for dysuria, hematuria, flank pain and difficulty urinating.  Skin: Negative for color change and rash.  Neurological: Positive for headaches. Negative for dizziness, facial asymmetry, weakness and numbness.  All other  systems reviewed and are negative.    Allergies  Review of patient's allergies indicates no known allergies.  Home Medications   Current Outpatient Rx  Name  Route  Sig  Dispense  Refill  . ASPIRIN EC 81 MG PO TBEC   Oral   Take 81 mg by mouth daily.         Marland Kitchen CALCIUM CARBONATE ANTACID 1000 MG PO CHEW   Oral   Chew 1,000 mg by mouth 2 (two) times daily.         Marland Kitchen SLEEP AID PO   Oral   Take 0.5 tablets by mouth at bedtime as needed. For sleep         . LEVOTHYROXINE SODIUM 50 MCG PO TABS   Oral   Take 50 mcg by mouth every other day. Alternate days with synthroid 75 mcg         . LEVOTHYROXINE SODIUM 75 MCG PO TABS   Oral   Take 75 mcg by mouth every other day. Alternate days with synthroid 50 mcg         . PAROXETINE HCL 30 MG PO TABS   Oral   Take 30 mg by mouth every evening.          . TRAZODONE HCL 50 MG PO TABS   Oral   Take 25 mg by mouth at bedtime as needed. For sleep           BP 120/55  Pulse 72  Temp 98.2 F (36.8 C) (Oral)  Resp 16  SpO2 97%  Physical Exam  Nursing note and vitals reviewed. Constitutional: She is oriented to person, place, and time. She appears well-developed and well-nourished. No distress.  HENT:  Head: Normocephalic and atraumatic.  Mouth/Throat: Oropharynx is clear and moist.  Eyes: Conjunctivae normal and EOM are normal. Pupils are equal, round, and reactive to light. Right eye exhibits no discharge. Left eye exhibits no discharge.  Neck: Normal range of motion. Neck supple. No tracheal deviation present.  Cardiovascular: Normal rate, regular rhythm, normal heart sounds and intact distal pulses.   Pulmonary/Chest: Effort normal and breath sounds normal. No stridor. No respiratory distress. She has no wheezes. She has no rales. She exhibits no tenderness.  Abdominal: Soft. Bowel sounds are normal. She exhibits no distension. There is no tenderness. There is no guarding.  Musculoskeletal: Normal range of motion.  She exhibits no edema and no tenderness.  Neurological: She is alert and oriented to person, place, and time. She has normal strength and normal reflexes. She displays normal reflexes. No cranial nerve deficit or sensory deficit. She displays a negative Romberg sign. Coordination and gait normal. GCS eye subscore is 4. GCS verbal subscore is 5. GCS motor subscore is 6.  Skin: Skin is warm and dry.  Psychiatric: She has a normal mood and affect. Her behavior is normal.    ED Course  Procedures (including critical care time)  Labs Reviewed  COMPREHENSIVE METABOLIC PANEL - Abnormal; Notable for the following:    Potassium 3.3 (*)     Glucose, Bld 114 (*)     All other components within normal limits  D-DIMER, QUANTITATIVE - Abnormal; Notable for the following:    D-Dimer, Quant 0.76 (*)     All other components within normal limits  CBC WITH DIFFERENTIAL  POCT I-STAT TROPONIN I  TROPONIN I   Dg Chest 2 View  02/19/2012  *RADIOLOGY REPORT*  Clinical Data: Headache shortness of breath.  Chest pain.  CHEST - 2 VIEW  Comparison: Chest radiograph 04/10/2009 and 05/11/2007  Findings: Normal heart size.  Stable tortuous thoracic aorta with atherosclerotic calcification.  Pulmonary vascularity is normal. The lungs are clear.  Negative for pleural effusion, airspace disease, or pneumothorax.  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  Postsurgical changes of the upper lumbar spine are partially visualized. Stable mild convex left scoliosis of the thoracic spine.  No acute osseous abnormality identified.  IMPRESSION: No acute cardiopulmonary disease.   Original Report Authenticated By: Britta Mccreedy, M.D.    Ct Angio Chest W/cm &/or Wo Cm  02/19/2012  *RADIOLOGY REPORT*  Clinical Data: Shortness of breath.  Elevated D-dimer.  CT ANGIOGRAPHY CHEST  Technique:  Multidetector CT imaging of the chest using the standard protocol during bolus administration of  intravenous contrast. Multiplanar reconstructed images including MIPs were obtained and reviewed to evaluate the vascular anatomy.  Contrast: 80mL OMNIPAQUE IOHEXOL 350 MG/ML SOLN  Comparison: None.  Findings: The chest wall is unremarkable.  No breast masses, supraclavicular or axillary lymphadenopathy.  The bony thorax is intact.  No destructive bone lesions or spinal canal compromise. There is a moderate thoracolumbar scoliosis.  The heart is normal in size.  No pericardial effusion.  No mediastinal or hilar lymphadenopathy.  The esophagus is grossly normal.  The aorta is normal in caliber.  Moderate atherosclerotic calcifications.  Anatomic variation of an aberrant right subclavian artery is noted.  The pulmonary arterial tree is fairly well opacified.  No filling defects to suggest pulmonary emboli.  The lungs are clear except for dependent atelectasis.  The upper abdomen is unremarkable.  IMPRESSION:  1.  No CT findings for pulmonary embolism. 2.  Moderate atherosclerotic changes involving the aorta but no focal aneurysm. 3.  Incidental aberrant right subclavian artery. 4.  No acute pulmonary findings.   Original Report Authenticated By: Rudie Meyer, M.D.      1. Chest pain   2. Headache      Date: 02/19/2012  Rate: 83  Rhythm: normal sinus rhythm  QRS Axis: normal  Intervals: normal  ST/T Wave abnormalities: nonspecific T wave changes  Conduction Disutrbances:none  Narrative Interpretation:   Old EKG Reviewed: none available    MDM    72 y/o F p/w HA and CP. Resolved. Atypical. Not thunderclap worst headache of life. Elevated d-dimer. CTA chest to assess for possible PE. negative HDS, af. NAD.    Headache Doubt idiopathic intracranial hypertension, cerebral venous thrombosis, temporal arteritis, skull fracture, epidural hematoma, subdural hematoma, subarachnoid hemorrhage, or other intracranial hemorrhage, concussion, trigeminal neuralgia, cluster headache, eye pathology or other  emergent pathology as this is an atypical history and physical, low risk, and primary diagnosis is much more likely.   Chest pain Unlikely PNA as CXR negative, no leukocytosis, no cough, no fever Unlikely ACS as troponin neg, EKG largely unremarkable, low risk per [facility] Unlikely PE as atypical presentation, low risk per [facility],  CTA chest negative Doubt Aortic Dissection, Pancreatitis, Arrhythmia, Pneumothorax, Endo/Myo/Pericarditis, Shingles, Emergent complications of an Ulcer, Esophageal pathology, or other emergent pathology.   Dc home. Return precautions given. Follow up with primary care provider. Patient in agreement with plan    Labs and imaging reviewed by myself and considered in medical decision making if ordered. Imaging interpreted by radiology.   Discussed case with Dr. Rubin Payor who is in agreement with assessment and plan.          Stevie Kern, MD 02/20/12 0005

## 2012-02-19 NOTE — ED Notes (Signed)
Patient transported to CT 

## 2012-02-19 NOTE — ED Notes (Signed)
Pt c/o generalized HA with CP that is now resolved and SOB starting today

## 2012-02-20 NOTE — ED Provider Notes (Signed)
I saw and evaluated the patient, reviewed the resident's note and I agree with the findings and plan and agree with their ECG interpretation. Patient with headache and chest pain. CT reassuring. Doubt cardiac cause. Patient feels better and be discharged home  Juliet Rude. Rubin Payor, MD 02/20/12 [phone]

## 2012-04-19 DIAGNOSIS — H6120 Impacted cerumen, unspecified ear: Secondary | ICD-10-CM | POA: Diagnosis not present

## 2012-05-02 DIAGNOSIS — M545 Low back pain, unspecified: Secondary | ICD-10-CM | POA: Diagnosis not present

## 2012-05-02 DIAGNOSIS — M5380 Other specified dorsopathies, site unspecified: Secondary | ICD-10-CM | POA: Diagnosis not present

## 2012-05-24 DIAGNOSIS — L821 Other seborrheic keratosis: Secondary | ICD-10-CM | POA: Diagnosis not present

## 2012-05-24 DIAGNOSIS — M545 Low back pain, unspecified: Secondary | ICD-10-CM | POA: Diagnosis not present

## 2012-05-24 DIAGNOSIS — L42 Pityriasis rosea: Secondary | ICD-10-CM | POA: Diagnosis not present

## 2012-05-24 DIAGNOSIS — M5380 Other specified dorsopathies, site unspecified: Secondary | ICD-10-CM | POA: Diagnosis not present

## 2012-06-01 DIAGNOSIS — Z86010 Personal history of colonic polyps: Secondary | ICD-10-CM | POA: Diagnosis not present

## 2012-06-01 DIAGNOSIS — K5730 Diverticulosis of large intestine without perforation or abscess without bleeding: Secondary | ICD-10-CM | POA: Diagnosis not present

## 2012-06-01 DIAGNOSIS — D126 Benign neoplasm of colon, unspecified: Secondary | ICD-10-CM | POA: Diagnosis not present

## 2012-06-01 DIAGNOSIS — Z09 Encounter for follow-up examination after completed treatment for conditions other than malignant neoplasm: Secondary | ICD-10-CM | POA: Diagnosis not present

## 2012-06-09 DIAGNOSIS — H0230 Blepharochalasis unspecified eye, unspecified eyelid: Secondary | ICD-10-CM | POA: Diagnosis not present

## 2012-06-09 DIAGNOSIS — H1045 Other chronic allergic conjunctivitis: Secondary | ICD-10-CM | POA: Diagnosis not present

## 2012-06-09 DIAGNOSIS — Z961 Presence of intraocular lens: Secondary | ICD-10-CM | POA: Diagnosis not present

## 2012-06-09 DIAGNOSIS — H04129 Dry eye syndrome of unspecified lacrimal gland: Secondary | ICD-10-CM | POA: Diagnosis not present

## 2012-07-10 ENCOUNTER — Emergency Department (HOSPITAL_COMMUNITY)
Admission: EM | Admit: 2012-07-10 | Discharge: 2012-07-10 | Disposition: A | Discharge status: Home / Self Care | Payer: Medicare Other | Attending: Emergency Medicine | Admitting: Emergency Medicine

## 2012-07-10 ENCOUNTER — Encounter (HOSPITAL_COMMUNITY): Payer: Self-pay | Admitting: Emergency Medicine

## 2012-07-10 DIAGNOSIS — Z87891 Personal history of nicotine dependence: Secondary | ICD-10-CM | POA: Diagnosis not present

## 2012-07-10 DIAGNOSIS — N3090 Cystitis, unspecified without hematuria: Secondary | ICD-10-CM | POA: Diagnosis not present

## 2012-07-10 DIAGNOSIS — F329 Major depressive disorder, single episode, unspecified: Secondary | ICD-10-CM | POA: Diagnosis not present

## 2012-07-10 DIAGNOSIS — F411 Generalized anxiety disorder: Secondary | ICD-10-CM | POA: Insufficient documentation

## 2012-07-10 DIAGNOSIS — F3289 Other specified depressive episodes: Secondary | ICD-10-CM | POA: Insufficient documentation

## 2012-07-10 DIAGNOSIS — M129 Arthropathy, unspecified: Secondary | ICD-10-CM | POA: Diagnosis not present

## 2012-07-10 DIAGNOSIS — Z79899 Other long term (current) drug therapy: Secondary | ICD-10-CM | POA: Diagnosis not present

## 2012-07-10 DIAGNOSIS — E039 Hypothyroidism, unspecified: Secondary | ICD-10-CM | POA: Diagnosis not present

## 2012-07-10 DIAGNOSIS — Z87442 Personal history of urinary calculi: Secondary | ICD-10-CM | POA: Diagnosis not present

## 2012-07-10 DIAGNOSIS — N390 Urinary tract infection, site not specified: Secondary | ICD-10-CM

## 2012-07-10 DIAGNOSIS — Z7982 Long term (current) use of aspirin: Secondary | ICD-10-CM | POA: Diagnosis not present

## 2012-07-10 DIAGNOSIS — N3000 Acute cystitis without hematuria: Secondary | ICD-10-CM | POA: Diagnosis not present

## 2012-07-10 DIAGNOSIS — N3091 Cystitis, unspecified with hematuria: Secondary | ICD-10-CM

## 2012-07-10 LAB — URINE MICROSCOPIC-ADD ON

## 2012-07-10 LAB — URINALYSIS, ROUTINE W REFLEX MICROSCOPIC
Nitrite: POSITIVE — AB
Protein, ur: >300 mg/dL — AB
Urobilinogen, UA: 1 mg/dL (ref 0.0–1.0)

## 2012-07-10 MED ORDER — PHENAZOPYRIDINE HCL 200 MG PO TABS: 200.0000 mg | ORAL_TABLET | Freq: Three times a day (TID) | ORAL | Status: DC | PRN

## 2012-07-10 MED ORDER — PHENAZOPYRIDINE HCL 200 MG PO TABS
200.0000 mg | ORAL_TABLET | Freq: Three times a day (TID) | ORAL | Status: DC
  Administered 2012-07-10: 200 mg via ORAL
  Filled 2012-07-10: qty 1

## 2012-07-10 MED ORDER — CEPHALEXIN 500 MG PO CAPS: 500.0000 mg | ORAL_CAPSULE | Freq: Four times a day (QID) | ORAL | Status: DC

## 2012-07-10 MED ORDER — CEPHALEXIN 500 MG PO CAPS
500.0000 mg | ORAL_CAPSULE | Freq: Three times a day (TID) | ORAL | Status: DC
  Administered 2012-07-10: 500 mg via ORAL
  Filled 2012-07-10: qty 1

## 2012-07-10 NOTE — ED Provider Notes (Signed)
History     CSN: [id number]  Arrival date & time 07/10/12  4034   First MD Initiated Contact with Patient 07/10/12 0701      Chief Complaint  Patient presents with  . Hematuria    (Consider location/radiation/quality/duration/timing/severity/associated sxs/prior treatment) HPI Patient reports this morning she started seeing blood in her urine. She relates she's had dysuria for the past 3 days with burning. She's had urgency and states she was having trouble with dribbling but that has gotten better. She denies any nausea, vomiting, fever, abdominal pain, or flank pain. She states she's only had blood in her urine in the past when she had kidney stones but she has not had any pain like her kidney stones in the past.  PCP Dr Roseanne [facility] Dr Isabel Caprice  Past Medical History  Diagnosis Date  . Hypothyroidism     treatment for Graves disease  . Depression   . Anxiety     panic attack many yrs. ago   . Arthritis     OA- hands & knees, lumbar stenosis, radiculopathy   . Renal calculi     x3 episodes     Past Surgical History  Procedure Laterality Date  . Back surgery      2009, diskectomy lumbar   . Hemorroidectomy      45 yrs. ago  . Tonsillectomy      as a child   . Eye surgery      cataracts removed bilateral - /w IOL   . Anterior lat lumbar fusion  05/04/2011    Procedure: ANTERIOR LATERAL LUMBAR FUSION 3 LEVELS;  Surgeon: Barnett Abu, MD;  Location: [facility];  Service: Neurosurgery;  Laterality: N/A;  Lumbar Two-Three, Lumbar Three-Four, Lumbar Four-Five Anterolateral decompression/arthrodesis.    Family History  Problem Relation Age of Onset  . Anesthesia problems Neg Hx     History  Substance Use Topics  . Smoking status: Former Smoker    Quit date: 04/28/1988  . Smokeless tobacco: Not on file  . Alcohol Use: Yes     Comment: occas. glass of wine   lives at home Lives with spouse  OB History   Grav Para Term Preterm Abortions TAB SAB Ect Mult Living                   Review of Systems  All other systems reviewed and are negative.    Allergies  Review of patient's allergies indicates no known allergies.  Home Medications   Current Outpatient Rx  Name  Route  Sig  Dispense  Refill  . aspirin EC 81 MG tablet   Oral   Take 81 mg by mouth at bedtime.          . calcium carbonate (TUMS - DOSED IN MG ELEMENTAL CALCIUM) 500 MG chewable tablet   Oral   Chew 2 tablets by mouth 2 (two) times daily.         . Doxylamine Succinate, Sleep, (SLEEP AID PO)   Oral   Take 0.5 tablets by mouth at bedtime as needed. For sleep         . levothyroxine (SYNTHROID, LEVOTHROID) 50 MCG tablet   Oral   Take 50 mcg by mouth every other day. Alternate days with synthroid 75 mcg         . levothyroxine (SYNTHROID, LEVOTHROID) 75 MCG tablet   Oral   Take 75 mcg by mouth every other day. Alternate days with synthroid 50 mcg         .  PARoxetine (PAXIL) 30 MG tablet   Oral   Take 30 mg by mouth every evening.          . traZODone (DESYREL) 50 MG tablet   Oral   Take 25 mg by mouth at bedtime as needed for sleep.            BP 126/64  Pulse 98  Temp(Src) 98.5 F (36.9 C) (Oral)  Resp 17  Ht 5\' 6"  (1.676 m)  Wt 152 lb (68.947 kg)  BMI 24.55 kg/m2  SpO2 95%  Vital signs normal    Physical Exam  Nursing note and vitals reviewed. Constitutional: She is oriented to person, place, and time. She appears well-developed and well-nourished.  Non-toxic appearance. She does not appear ill. No distress.  HENT:  Head: Normocephalic and atraumatic.  Right Ear: External ear normal.  Left Ear: External ear normal.  Nose: Nose normal. No mucosal edema or rhinorrhea.  Mouth/Throat: Oropharynx is clear and moist and mucous membranes are normal. No dental abscesses or edematous.  Eyes: Conjunctivae and EOM are normal. Pupils are equal, round, and reactive to light.  Neck: Normal range of motion and full passive range of motion  without pain. Neck supple.  Cardiovascular: Normal rate, regular rhythm and normal heart sounds.  Exam reveals no gallop and no friction rub.   No murmur heard. Pulmonary/Chest: Effort normal and breath sounds normal. No respiratory distress. She has no wheezes. She has no rhonchi. She has no rales. She exhibits no tenderness and no crepitus.  Abdominal: Soft. Normal appearance and bowel sounds are normal. She exhibits no distension. There is no tenderness. There is no rebound and no guarding.  No flank tenderness, nontender suprapubic area  Musculoskeletal: Normal range of motion. She exhibits no edema and no tenderness.  Moves all extremities well.   Neurological: She is alert and oriented to person, place, and time. She has normal strength. No cranial nerve deficit.  Skin: Skin is warm, dry and intact. No rash noted. No erythema. No pallor.  Psychiatric: She has a normal mood and affect. Her speech is normal and behavior is normal. Her mood appears not anxious.    ED Course  Procedures (including critical care time)  Medications  cephALEXin (KEFLEX) capsule 500 mg (not administered)  phenazopyridine (PYRIDIUM) tablet 200 mg (not administered)   Discussed she needs to be rechecked to make sure the infection is cleared and to make sure there isn't another etiology of the hematuria (bladder stones, bladder tumors).   Results for orders placed during the hospital encounter of 07/10/12  URINALYSIS, ROUTINE W REFLEX MICROSCOPIC      Result Value Range   Color, Urine RED (*) YELLOW   APPearance TURBID (*) CLEAR   Specific Gravity, Urine 1.023  1.005 - 1.030   pH 5.0  5.0 - 8.0   Glucose, UA NEGATIVE  NEGATIVE mg/dL   Hgb urine dipstick LARGE (*) NEGATIVE   Bilirubin Urine LARGE (*) NEGATIVE   Ketones, ur 40 (*) NEGATIVE mg/dL   Protein, ur >161 (*) NEGATIVE mg/dL   Urobilinogen, UA 1.0  0.0 - 1.0 mg/dL   Nitrite POSITIVE (*) NEGATIVE   Leukocytes, UA LARGE (*) NEGATIVE  URINE  MICROSCOPIC-ADD ON      Result Value Range   WBC, UA TOO NUMEROUS TO COUNT  <3 WBC/hpf   RBC / HPF TOO NUMEROUS TO COUNT  <3 RBC/hpf   Bacteria, UA MANY (*) RARE    Laboratory interpretation all normal except UTI  1. UTI (urinary tract infection)   2. Hemorrhagic cystitis    New Prescriptions   CEPHALEXIN (KEFLEX) 500 MG CAPSULE    Take 1 capsule (500 mg total) by mouth 4 (four) times daily.   PHENAZOPYRIDINE (PYRIDIUM) 200 MG TABLET    Take 1 tablet (200 mg total) by mouth 3 (three) times daily as needed for pain.    Plan discharge  Devoria Albe, MD, FACEP    MDM          Ward Givens, MD 07/10/12 [phone]

## 2012-07-10 NOTE — ED Notes (Signed)
Pt states she felt for the past 3 days discomfort in vaginal area. Pt states she has the urgency in urinating but have difficulty time starting. Pt states she notice blood in her urine this morning.

## 2012-07-10 NOTE — ED Notes (Signed)
She ambulates to b.r. Without difficulty and gives a urine specimen.  It is dark red brown and is opaque.  She c/o minimal dysuria/starting stream, otherwise denies any pain or discomfort.

## 2012-07-12 LAB — URINE CULTURE

## 2012-07-13 NOTE — ED Notes (Signed)
Post ED Visit - Positive Culture Follow-up  Culture report reviewed by antimicrobial stewardship pharmacist: [x]  Wes Dulaney, Pharm.D., BCPS []  Celedonio Miyamoto, Pharm.D., BCPS []  Georgina Pillion, Pharm.D., BCPS []  Parker, [address].D., BCPS, AAHIVP []  Estella Husk, Pharm.D., BCPS, AAHIVP  Positive urine culture  no further patient follow-up is required at this time.  Larena Sox 07/13/2012, 4:35 PM

## 2012-07-18 DIAGNOSIS — N3000 Acute cystitis without hematuria: Secondary | ICD-10-CM | POA: Diagnosis not present

## 2012-07-18 DIAGNOSIS — R310 Gross hematuria: Secondary | ICD-10-CM | POA: Diagnosis not present

## 2012-07-19 DIAGNOSIS — N3000 Acute cystitis without hematuria: Secondary | ICD-10-CM | POA: Diagnosis not present

## 2012-08-04 DIAGNOSIS — N281 Cyst of kidney, acquired: Secondary | ICD-10-CM | POA: Diagnosis not present

## 2012-09-08 DIAGNOSIS — J309 Allergic rhinitis, unspecified: Secondary | ICD-10-CM | POA: Diagnosis not present

## 2012-09-08 DIAGNOSIS — J3089 Other allergic rhinitis: Secondary | ICD-10-CM | POA: Diagnosis not present

## 2012-09-15 DIAGNOSIS — M25539 Pain in unspecified wrist: Secondary | ICD-10-CM | POA: Diagnosis not present

## 2012-10-13 DIAGNOSIS — M25539 Pain in unspecified wrist: Secondary | ICD-10-CM | POA: Diagnosis not present

## 2012-10-17 DIAGNOSIS — M545 Low back pain, unspecified: Secondary | ICD-10-CM | POA: Diagnosis not present

## 2012-10-17 DIAGNOSIS — M47817 Spondylosis without myelopathy or radiculopathy, lumbosacral region: Secondary | ICD-10-CM | POA: Diagnosis not present

## 2012-10-20 DIAGNOSIS — Z1331 Encounter for screening for depression: Secondary | ICD-10-CM | POA: Diagnosis not present

## 2012-10-20 DIAGNOSIS — E039 Hypothyroidism, unspecified: Secondary | ICD-10-CM | POA: Diagnosis not present

## 2012-10-20 DIAGNOSIS — Z0000 Encounter for general adult medical examination without abnormal findings: Secondary | ICD-10-CM | POA: Diagnosis not present

## 2012-11-02 ENCOUNTER — Emergency Department (HOSPITAL_COMMUNITY)
Admission: EM | Admit: 2012-11-02 | Discharge: 2012-11-03 | Disposition: A | Discharge status: Home / Self Care | Payer: Medicare Other | Attending: Emergency Medicine | Admitting: Emergency Medicine

## 2012-11-02 ENCOUNTER — Encounter (HOSPITAL_COMMUNITY): Payer: Self-pay | Admitting: Emergency Medicine

## 2012-11-02 DIAGNOSIS — N200 Calculus of kidney: Secondary | ICD-10-CM | POA: Diagnosis not present

## 2012-11-02 DIAGNOSIS — R1110 Vomiting, unspecified: Secondary | ICD-10-CM | POA: Insufficient documentation

## 2012-11-02 DIAGNOSIS — Z87891 Personal history of nicotine dependence: Secondary | ICD-10-CM | POA: Insufficient documentation

## 2012-11-02 DIAGNOSIS — F3289 Other specified depressive episodes: Secondary | ICD-10-CM | POA: Diagnosis not present

## 2012-11-02 DIAGNOSIS — F329 Major depressive disorder, single episode, unspecified: Secondary | ICD-10-CM | POA: Diagnosis not present

## 2012-11-02 DIAGNOSIS — F411 Generalized anxiety disorder: Secondary | ICD-10-CM | POA: Diagnosis not present

## 2012-11-02 DIAGNOSIS — M129 Arthropathy, unspecified: Secondary | ICD-10-CM | POA: Diagnosis not present

## 2012-11-02 DIAGNOSIS — E039 Hypothyroidism, unspecified: Secondary | ICD-10-CM | POA: Insufficient documentation

## 2012-11-02 DIAGNOSIS — R509 Fever, unspecified: Secondary | ICD-10-CM | POA: Insufficient documentation

## 2012-11-02 DIAGNOSIS — Z79899 Other long term (current) drug therapy: Secondary | ICD-10-CM | POA: Insufficient documentation

## 2012-11-02 DIAGNOSIS — Z7982 Long term (current) use of aspirin: Secondary | ICD-10-CM | POA: Insufficient documentation

## 2012-11-02 DIAGNOSIS — N201 Calculus of ureter: Secondary | ICD-10-CM | POA: Diagnosis not present

## 2012-11-02 LAB — CBC WITH DIFFERENTIAL/PLATELET
Basophils Absolute: 0 10*3/uL (ref 0.0–0.1)
Eosinophils Absolute: 0.1 10*3/uL (ref 0.0–0.7)
Eosinophils Relative: 1 % (ref 0–5)
Lymphocytes Relative: 13 % (ref 12–46)
MCV: 86.7 fL (ref 78.0–100.0)
Neutrophils Relative %: 78 % — ABNORMAL HIGH (ref 43–77)
Platelets: 329 10*3/uL (ref 150–400)
RDW: 13 % (ref 11.5–15.5)
WBC: 12.7 10*3/uL — ABNORMAL HIGH (ref 4.0–10.5)

## 2012-11-02 LAB — URINE MICROSCOPIC-ADD ON

## 2012-11-02 LAB — URINALYSIS, ROUTINE W REFLEX MICROSCOPIC
Nitrite: NEGATIVE
Specific Gravity, Urine: 1.027 (ref 1.005–1.030)
pH: 5.5 (ref 5.0–8.0)

## 2012-11-02 MED ORDER — ONDANSETRON HCL 4 MG/2ML IJ SOLN
4.0000 mg | Freq: Once | INTRAMUSCULAR | Status: AC
  Administered 2012-11-02: 4 mg via INTRAVENOUS
  Filled 2012-11-02: qty 2

## 2012-11-02 MED ORDER — MORPHINE SULFATE 4 MG/ML IJ SOLN
2.0000 mg | Freq: Once | INTRAMUSCULAR | Status: AC
  Administered 2012-11-02: 2 mg via INTRAVENOUS
  Filled 2012-11-02: qty 1

## 2012-11-02 MED ORDER — MORPHINE SULFATE 4 MG/ML IJ SOLN: 4.0000 mg | Freq: Once | INTRAMUSCULAR | Status: DC

## 2012-11-02 NOTE — ED Notes (Signed)
Pt c/o LLQ pain with nausea and vomiting. Pt states symptoms started at 1730 tonight. Pt denies diarrhea. Pt states pain is constant. States she had a loose BM today. Pt arrives with family member. Pt with no acute distress. Skin warm, dry.

## 2012-11-02 NOTE — ED Provider Notes (Signed)
CSN: [id number]     Arrival date & time 11/02/12  2046 History   First MD Initiated Contact with Patient 11/02/12 2239     Chief Complaint  Patient presents with  . Abdominal Pain  . Emesis   (Consider location/radiation/quality/duration/timing/severity/associated sxs/prior Treatment) HPI  CARA THAXTON is a 72 y.o. female with PMH of graves disease and kidney stone, one passed spon requiring basket reterival by Dr. Isabel Caprice c/o acute onset of left sided ,colicky, severe, abd pain associated with 2x episodes of NBNB emesis and subjective fever and chills. Onset at 17:30, feels similar to prior kidney stones. Denies Change in bowel or bladder habits, specifically hematuria, CP, SOB. Pain is 8/10 currently.    Past Medical History  Diagnosis Date  . Hypothyroidism     treatment for Graves disease  . Depression   . Anxiety     panic attack many yrs. ago   . Arthritis     OA- hands & knees, lumbar stenosis, radiculopathy   . Renal calculi     x3 episodes    Past Surgical History  Procedure Laterality Date  . Back surgery      2009, diskectomy lumbar   . Hemorroidectomy      45 yrs. ago  . Tonsillectomy      as a child   . Eye surgery      cataracts removed bilateral - /w IOL   . Anterior lat lumbar fusion  05/04/2011    Procedure: ANTERIOR LATERAL LUMBAR FUSION 3 LEVELS;  Surgeon: Barnett Abu, MD;  Location: [facility];  Service: Neurosurgery;  Laterality: N/A;  Lumbar Two-Three, Lumbar Three-Four, Lumbar Four-Five Anterolateral decompression/arthrodesis.   Family History  Problem Relation Age of Onset  . Anesthesia problems Neg Hx    History  Substance Use Topics  . Smoking status: Former Smoker    Quit date: 04/28/1988  . Smokeless tobacco: Not on file  . Alcohol Use: Yes     Comment: occas. glass of wine    OB History   Grav Para Term Preterm Abortions TAB SAB Ect Mult Living                 Review of Systems 10 systems reviewed and found to be negative,  except as noted in the HPI    Allergies  Review of patient's allergies indicates no known allergies.  Home Medications   Current Outpatient Rx  Name  Route  Sig  Dispense  Refill  . aspirin EC 81 MG tablet   Oral   Take 81 mg by mouth at bedtime.          . calcium carbonate (TUMS - DOSED IN MG ELEMENTAL CALCIUM) 500 MG chewable tablet   Oral   Chew 2 tablets by mouth 2 (two) times daily.         . cholecalciferol (VITAMIN D) 1000 UNITS tablet   Oral   Take 1,000 Units by mouth daily.         . Doxylamine Succinate, Sleep, (SLEEP AID PO)   Oral   Take 0.5 tablets by mouth at bedtime as needed. For sleep         . levothyroxine (SYNTHROID, LEVOTHROID) 50 MCG tablet   Oral   Take 50 mcg by mouth every other day. Alternate days with synthroid 75 mcg         . levothyroxine (SYNTHROID, LEVOTHROID) 75 MCG tablet   Oral   Take 75 mcg by mouth every other  day. Alternate days with synthroid 50 mcg         . PARoxetine (PAXIL) 30 MG tablet   Oral   Take 30 mg by mouth every evening.          . traZODone (DESYREL) 50 MG tablet   Oral   Take 25 mg by mouth at bedtime as needed for sleep.           BP 115/82  Pulse 91  Temp(Src) 99.9 F (37.7 C) (Oral)  Resp 18  SpO2 99% Physical Exam  Nursing note and vitals reviewed. Constitutional: She is oriented to person, place, and time. She appears well-developed and well-nourished. No distress.  HENT:  Head: Normocephalic.  Mouth/Throat: Oropharynx is clear and moist.  Eyes: Conjunctivae and EOM are normal. Pupils are equal, round, and reactive to light.  Neck: Normal range of motion.  Cardiovascular: Normal rate, regular rhythm and intact distal pulses.   Pulmonary/Chest: Effort normal and breath sounds normal. No stridor. No respiratory distress. She has no wheezes. She has no rales. She exhibits no tenderness.  Abdominal: Soft. Bowel sounds are normal. She exhibits no distension and no mass. There is  tenderness. There is no rebound and no guarding.  Mild TTP in mid left quadrant with no guarding or rebound.  Musculoskeletal: Normal range of motion.  Neurological: She is alert and oriented to person, place, and time.  Psychiatric: She has a normal mood and affect.    ED Course  Procedures (including critical care time) Labs Review Labs Reviewed  CBC WITH DIFFERENTIAL - Abnormal; Notable for the following:    WBC 12.7 (*)    Neutrophils Relative % 78 (*)    Neutro Abs 9.9 (*)    All other components within normal limits  BASIC METABOLIC PANEL - Abnormal; Notable for the following:    Glucose, Bld 119 (*)    BUN 29 (*)    GFR calc non Af Amer 86 (*)    All other components within normal limits  URINALYSIS, ROUTINE W REFLEX MICROSCOPIC - Abnormal; Notable for the following:    APPearance CLOUDY (*)    Hgb urine dipstick LARGE (*)    Bilirubin Urine SMALL (*)    Ketones, ur 15 (*)    Leukocytes, UA SMALL (*)    All other components within normal limits  URINE MICROSCOPIC-ADD ON - Abnormal; Notable for the following:    Crystals CA OXALATE CRYSTALS (*)    All other components within normal limits  URINE CULTURE  HEPATIC FUNCTION PANEL  LIPASE, BLOOD   Imaging Review Ct Abdomen Pelvis Wo Contrast  11/03/2012   CLINICAL DATA:  Right flank pain.  EXAM: CT ABDOMEN AND PELVIS WITHOUT CONTRAST  TECHNIQUE: Multidetector CT imaging of the abdomen and pelvis was performed following the standard protocol without intravenous contrast.  COMPARISON:  CT 04/30/2002  FINDINGS: Lung bases are clear. No effusions. Heart is normal size.  Scattered hypodensities in the liver, difficult to characterize on this unenhanced study but most likely small cysts. Gallbladder, spleen, pancreas, adrenals and right kidney are unremarkable. There is mild left hydronephrosis. 2 mm stone in the proximal left ureter. 2.3 cm exophytic lesion off the midpole of the left kidney, similar density to the kidney. I cannot  exclude a solid renal lesion. When comparing to prior study from 2004, this appears slightly larger. Urinary bladder is decompressed. Uterus and adnexa grossly unremarkable. Appendix is visualized and is normal. Moderate stool throughout the colon. Small bowel is  decompressed.  No acute bony abnormality. Postoperative changes in the lumbar spine.  IMPRESSION: 2 mm proximal left ureteral stone with mild left hydronephrosis.  2.3 cm exophytic lesion off the midpole of the left kidney, nonspecific. This is slightly since 2004. This could be further characterized with contrast-enhanced CT or MRI. This should be performed after the obstructive uropathy has resolved.   Electronically Signed   By: Charlett Nose M.D.   On: 11/03/2012 01:48    MDM   1. Kidney stone      Filed Vitals:   11/02/12 2050 11/02/12 2053  BP: 164/87 115/82  Pulse: 81 91  Temp: 97.5 F (36.4 C) 99.9 F (37.7 C)  TempSrc: Oral Oral  Resp: 18 18  SpO2: 97% 99%     LILU MCGLOWN is a 72 y.o. female with left sided abd pain, colicky in nature severe to the point she vomited, consistent with prior episodes of kidney stones. ABD exam is non-surgical and Vitals wnl. UA shows hemoglobin. CT shows 2 mm proximal left ureteral stone with mild left hydronephrosis. There is also an abnormality at the midpole of the left kidney. CT with contrast is recommended after the hydronephrosis results. I discussed this with the patient.    Medications  morphine 4 MG/ML injection 4 mg (0 mg Intramuscular Hold 11/02/12 2322)  ondansetron (ZOFRAN) injection 4 mg (4 mg Intravenous Given 11/02/12 2322)  morphine 4 MG/ML injection 2 mg (2 mg Intravenous Given 11/02/12 2324)  sodium chloride 0.9 % bolus 1,000 mL (1,000 mLs Intravenous New Bag/Given 11/03/12 0142)    Pt is hemodynamically stable, appropriate for, and amenable to discharge at this time. Pt verbalized understanding and agrees with care plan. All questions answered. Outpatient follow-up  and specific return precautions discussed.    New Prescriptions   OXYCODONE-ACETAMINOPHEN (PERCOCET/ROXICET) 5-325 MG PER TABLET    1 to 2 tabs PO q6hrs  PRN for pain   PROMETHAZINE (PHENERGAN) 25 MG TABLET    Take 1 tablet (25 mg total) by mouth every 6 (six) hours as needed for nausea.   TAMSULOSIN (FLOMAX) 0.4 MG CAPS CAPSULE    Take 1 capsule (0.4 mg total) by mouth daily after breakfast.    Note: Portions of this report may have been transcribed using voice recognition software. Every effort was made to ensure accuracy; however, inadvertent computerized transcription errors may be present      Wynetta Emery, PA-C 11/03/12 0153

## 2012-11-03 ENCOUNTER — Emergency Department (HOSPITAL_COMMUNITY): Payer: Medicare Other

## 2012-11-03 DIAGNOSIS — N201 Calculus of ureter: Secondary | ICD-10-CM | POA: Diagnosis not present

## 2012-11-03 LAB — BASIC METABOLIC PANEL
Calcium: 9.5 mg/dL (ref 8.4–10.5)
GFR calc non Af Amer: 86 mL/min — ABNORMAL LOW (ref >90)
Potassium: 3.5 mEq/L (ref 3.5–5.1)
Sodium: 136 mEq/L (ref 135–145)

## 2012-11-03 LAB — HEPATIC FUNCTION PANEL
Albumin: 4.1 g/dL (ref 3.5–5.2)
Total Protein: 7.9 g/dL (ref 6.0–8.3)

## 2012-11-03 LAB — LIPASE, BLOOD: Lipase: 40 U/L (ref 11–59)

## 2012-11-03 MED ORDER — MORPHINE SULFATE 4 MG/ML IJ SOLN
2.0000 mg | Freq: Once | INTRAMUSCULAR | Status: AC
  Administered 2012-11-03: 2 mg via INTRAVENOUS
  Filled 2012-11-03: qty 1

## 2012-11-03 MED ORDER — TAMSULOSIN HCL 0.4 MG PO CAPS
0.4000 mg | ORAL_CAPSULE | Freq: Every day | ORAL | Status: DC
Start: 2012-11-03 — End: 2018-03-15

## 2012-11-03 MED ORDER — PROMETHAZINE HCL 25 MG PO TABS: 25.0000 mg | ORAL_TABLET | Freq: Four times a day (QID) | ORAL | Status: DC | PRN

## 2012-11-03 MED ORDER — OXYCODONE-ACETAMINOPHEN 5-325 MG PO TABS: ORAL_TABLET | ORAL | Status: DC

## 2012-11-03 MED ORDER — SODIUM CHLORIDE 0.9 % IV BOLUS (SEPSIS)
1000.0000 mL | Freq: Once | INTRAVENOUS | Status: AC
  Administered 2012-11-03: 1000 mL via INTRAVENOUS

## 2012-11-03 NOTE — ED Notes (Signed)
PA at bedside.

## 2012-11-03 NOTE — ED Provider Notes (Signed)
Medical screening examination/treatment/procedure(s) were conducted as a shared visit with non-physician practitioner(s) and myself.  I personally evaluated the patient during the encounter  L flank pain, TTP LLQ ABD on exam with kidney stone on CT scan. Pain control and Uro referral   Results for orders placed during the hospital encounter of 11/02/12  CBC WITH DIFFERENTIAL      Result Value Range   WBC 12.7 (*) 4.0 - 10.5 K/uL   RBC 4.81  3.87 - 5.11 MIL/uL   Hemoglobin 14.4  12.0 - 15.0 g/dL   HCT 98.1  19.1 - 47.8 %   MCV 86.7  78.0 - 100.0 fL   MCH 29.9  26.0 - 34.0 pg   MCHC 34.5  30.0 - 36.0 g/dL   RDW 29.5  62.1 - 30.8 %   Platelets 329  150 - 400 K/uL   Neutrophils Relative % 78 (*) 43 - 77 %   Neutro Abs 9.9 (*) 1.7 - 7.7 K/uL   Lymphocytes Relative 13  12 - 46 %   Lymphs Abs 1.7  0.7 - 4.0 K/uL   Monocytes Relative 8  3 - 12 %   Monocytes Absolute 1.0  0.1 - 1.0 K/uL   Eosinophils Relative 1  0 - 5 %   Eosinophils Absolute 0.1  0.0 - 0.7 K/uL   Basophils Relative 0  0 - 1 %   Basophils Absolute 0.0  0.0 - 0.1 K/uL  BASIC METABOLIC PANEL      Result Value Range   Sodium 136  135 - 145 mEq/L   Potassium 3.5  3.5 - 5.1 mEq/L   Chloride 102  96 - 112 mEq/L   CO2 20  19 - 32 mEq/L   Glucose, Bld 119 (*) 70 - 99 mg/dL   BUN 29 (*) 6 - 23 mg/dL   Creatinine, Ser 6.57  0.50 - 1.10 mg/dL   Calcium 9.5  8.4 - 84.6 mg/dL   GFR calc non Af Amer 86 (*) >90 mL/min   GFR calc Af Amer >90  >90 mL/min  HEPATIC FUNCTION PANEL      Result Value Range   Total Protein 7.9  6.0 - 8.3 g/dL   Albumin 4.1  3.5 - 5.2 g/dL   AST 29  0 - 37 U/L   ALT 34  0 - 35 U/L   Alkaline Phosphatase 56  39 - 117 U/L   Total Bilirubin 0.4  0.3 - 1.2 mg/dL   Bilirubin, Direct <9.6  0.0 - 0.3 mg/dL   Indirect Bilirubin NOT CALCULATED  0.3 - 0.9 mg/dL  LIPASE, BLOOD      Result Value Range   Lipase 40  11 - 59 U/L  URINALYSIS, ROUTINE W REFLEX MICROSCOPIC      Result Value Range   Color, Urine  YELLOW  YELLOW   APPearance CLOUDY (*) CLEAR   Specific Gravity, Urine 1.027  1.005 - 1.030   pH 5.5  5.0 - 8.0   Glucose, UA NEGATIVE  NEGATIVE mg/dL   Hgb urine dipstick LARGE (*) NEGATIVE   Bilirubin Urine SMALL (*) NEGATIVE   Ketones, ur 15 (*) NEGATIVE mg/dL   Protein, ur NEGATIVE  NEGATIVE mg/dL   Urobilinogen, UA 0.2  0.0 - 1.0 mg/dL   Nitrite NEGATIVE  NEGATIVE   Leukocytes, UA SMALL (*) NEGATIVE  URINE MICROSCOPIC-ADD ON      Result Value Range   Squamous Epithelial / LPF RARE  RARE   WBC, UA 0-2  <  3 WBC/hpf   RBC / HPF TOO NUMEROUS TO COUNT  <3 RBC/hpf   Crystals CA OXALATE CRYSTALS (*) NEGATIVE   Ct Abdomen Pelvis Wo Contrast  11/03/2012   CLINICAL DATA:  Right flank pain.  EXAM: CT ABDOMEN AND PELVIS WITHOUT CONTRAST  TECHNIQUE: Multidetector CT imaging of the abdomen and pelvis was performed following the standard protocol without intravenous contrast.  COMPARISON:  CT 04/30/2002  FINDINGS: Lung bases are clear. No effusions. Heart is normal size.  Scattered hypodensities in the liver, difficult to characterize on this unenhanced study but most likely small cysts. Gallbladder, spleen, pancreas, adrenals and right kidney are unremarkable. There is mild left hydronephrosis. 2 mm stone in the proximal left ureter. 2.3 cm exophytic lesion off the midpole of the left kidney, similar density to the kidney. I cannot exclude a solid renal lesion. When comparing to prior study from 2004, this appears slightly larger. Urinary bladder is decompressed. Uterus and adnexa grossly unremarkable. Appendix is visualized and is normal. Moderate stool throughout the colon. Small bowel is decompressed.  No acute bony abnormality. Postoperative changes in the lumbar spine.  IMPRESSION: 2 mm proximal left ureteral stone with mild left hydronephrosis.  2.3 cm exophytic lesion off the midpole of the left kidney, nonspecific. This is slightly since 2004. This could be further characterized with  contrast-enhanced CT or MRI. This should be performed after the obstructive uropathy has resolved.   Electronically Signed   By: Charlett Nose M.D.   On: 11/03/2012 01:48      Sunnie Nielsen, MD 11/03/12 [phone]

## 2012-11-04 ENCOUNTER — Encounter (HOSPITAL_COMMUNITY): Payer: Self-pay | Admitting: *Deleted

## 2012-11-04 ENCOUNTER — Emergency Department (HOSPITAL_COMMUNITY)
Admission: EM | Admit: 2012-11-04 | Discharge: 2012-11-04 | Disposition: A | Discharge status: Home / Self Care | Payer: Medicare Other | Attending: Emergency Medicine | Admitting: Emergency Medicine

## 2012-11-04 DIAGNOSIS — Z7982 Long term (current) use of aspirin: Secondary | ICD-10-CM | POA: Diagnosis not present

## 2012-11-04 DIAGNOSIS — Z87891 Personal history of nicotine dependence: Secondary | ICD-10-CM | POA: Diagnosis not present

## 2012-11-04 DIAGNOSIS — Z79899 Other long term (current) drug therapy: Secondary | ICD-10-CM | POA: Diagnosis not present

## 2012-11-04 DIAGNOSIS — E039 Hypothyroidism, unspecified: Secondary | ICD-10-CM | POA: Diagnosis not present

## 2012-11-04 DIAGNOSIS — F3289 Other specified depressive episodes: Secondary | ICD-10-CM | POA: Insufficient documentation

## 2012-11-04 DIAGNOSIS — N200 Calculus of kidney: Secondary | ICD-10-CM | POA: Insufficient documentation

## 2012-11-04 DIAGNOSIS — F411 Generalized anxiety disorder: Secondary | ICD-10-CM | POA: Insufficient documentation

## 2012-11-04 DIAGNOSIS — N281 Cyst of kidney, acquired: Secondary | ICD-10-CM | POA: Diagnosis not present

## 2012-11-04 DIAGNOSIS — R310 Gross hematuria: Secondary | ICD-10-CM | POA: Diagnosis not present

## 2012-11-04 DIAGNOSIS — M129 Arthropathy, unspecified: Secondary | ICD-10-CM | POA: Diagnosis not present

## 2012-11-04 DIAGNOSIS — N201 Calculus of ureter: Secondary | ICD-10-CM | POA: Diagnosis not present

## 2012-11-04 DIAGNOSIS — F329 Major depressive disorder, single episode, unspecified: Secondary | ICD-10-CM | POA: Diagnosis not present

## 2012-11-04 LAB — URINALYSIS, ROUTINE W REFLEX MICROSCOPIC
Ketones, ur: NEGATIVE mg/dL
Leukocytes, UA: NEGATIVE
Nitrite: NEGATIVE
Specific Gravity, Urine: 1.028 (ref 1.005–1.030)
pH: 5 (ref 5.0–8.0)

## 2012-11-04 LAB — POCT I-STAT, CHEM 8
BUN: 22 mg/dL (ref 6–23)
Calcium, Ion: 1.06 mmol/L — ABNORMAL LOW (ref 1.13–1.30)
TCO2: 18 mmol/L (ref 0–100)

## 2012-11-04 LAB — URINE MICROSCOPIC-ADD ON

## 2012-11-04 LAB — URINE CULTURE
Colony Count: NO GROWTH
Culture: NO GROWTH

## 2012-11-04 MED ORDER — ONDANSETRON HCL 4 MG/2ML IJ SOLN
4.0000 mg | Freq: Once | INTRAMUSCULAR | Status: AC
  Administered 2012-11-04: 4 mg via INTRAVENOUS
  Filled 2012-11-04: qty 2

## 2012-11-04 MED ORDER — SODIUM CHLORIDE 0.9 % IV BOLUS (SEPSIS)
1000.0000 mL | Freq: Once | INTRAVENOUS | Status: AC
  Administered 2012-11-04: 1000 mL via INTRAVENOUS

## 2012-11-04 MED ORDER — HYDROMORPHONE HCL PF 1 MG/ML IJ SOLN
1.0000 mg | Freq: Once | INTRAMUSCULAR | Status: AC
  Administered 2012-11-04: 1 mg via INTRAVENOUS
  Filled 2012-11-04: qty 1

## 2012-11-04 MED ORDER — KETOROLAC TROMETHAMINE 30 MG/ML IJ SOLN
15.0000 mg | Freq: Once | INTRAMUSCULAR | Status: AC
  Administered 2012-11-04: 15 mg via INTRAVENOUS
  Filled 2012-11-04: qty 1

## 2012-11-04 NOTE — ED Notes (Signed)
Sent notice to pharmacy that medications are needed.

## 2012-11-04 NOTE — ED Notes (Signed)
Pt states she was seen in the emergency on 10/2 and was discharged at 3pm. Pt states she took 2 percocet and and something for nausea around 10pm on 10/2 and has been vomiting during the night. Pt states she has an appointment with Dr. Isabel Caprice today.

## 2012-11-04 NOTE — ED Notes (Signed)
Patient has a urology appt in 30 min. Patient will fu with them.

## 2012-11-04 NOTE — ED Provider Notes (Addendum)
CSN: [id number]     Arrival date & time 11/04/12  0325 History   First MD Initiated Contact with Patient 11/04/12 0340     Chief Complaint  Patient presents with  . Emesis   (Consider location/radiation/quality/duration/timing/severity/associated sxs/prior Treatment) Patient is a 72 y.o. female presenting with abdominal pain. The history is provided by the patient.  Abdominal Pain Pain location:  LLQ Pain quality: sharp, shooting, stabbing and tearing   Pain radiates to:  L flank Pain severity:  Severe Onset quality:  Sudden Duration:  4 hours Timing:  Constant Progression:  Unchanged Chronicity:  Recurrent Context comment:  Seen 2 days ago and dx with left sided kidney stone.  doing well and pain controlled until 4-5 hours ago Relieved by:  Nothing Worsened by:  Nothing tried Ineffective treatments: percocet. Associated symptoms: nausea and vomiting   Associated symptoms: no cough, no diarrhea, no fever and no shortness of breath   Associated symptoms comment:  Frequency and urgency   Past Medical History  Diagnosis Date  . Hypothyroidism     treatment for Graves disease  . Depression   . Anxiety     panic attack many yrs. ago   . Arthritis     OA- hands & knees, lumbar stenosis, radiculopathy   . Renal calculi     x3 episodes    Past Surgical History  Procedure Laterality Date  . Back surgery      2009, diskectomy lumbar   . Hemorroidectomy      45 yrs. ago  . Tonsillectomy      as a child   . Eye surgery      cataracts removed bilateral - /w IOL   . Anterior lat lumbar fusion  05/04/2011    Procedure: ANTERIOR LATERAL LUMBAR FUSION 3 LEVELS;  Surgeon: Barnett Abu, MD;  Location: [facility];  Service: Neurosurgery;  Laterality: N/A;  Lumbar Two-Three, Lumbar Three-Four, Lumbar Four-Five Anterolateral decompression/arthrodesis.   Family History  Problem Relation Age of Onset  . Anesthesia problems Neg Hx    History  Substance Use Topics  . Smoking status:  Former Smoker    Quit date: 04/28/1988  . Smokeless tobacco: Not on file  . Alcohol Use: Yes     Comment: occas. glass of wine    OB History   Grav Para Term Preterm Abortions TAB SAB Ect Mult Living                 Review of Systems  Constitutional: Negative for fever.  Respiratory: Negative for cough and shortness of breath.   Gastrointestinal: Positive for nausea, vomiting and abdominal pain. Negative for diarrhea.  All other systems reviewed and are negative.    Allergies  Review of patient's allergies indicates no known allergies.  Home Medications   Current Outpatient Rx  Name  Route  Sig  Dispense  Refill  . aspirin EC 81 MG tablet   Oral   Take 81 mg by mouth at bedtime.          . calcium carbonate (TUMS - DOSED IN MG ELEMENTAL CALCIUM) 500 MG chewable tablet   Oral   Chew 2 tablets by mouth 2 (two) times daily.         . cholecalciferol (VITAMIN D) 1000 UNITS tablet   Oral   Take 1,000 Units by mouth daily.         . Doxylamine Succinate, Sleep, (SLEEP AID PO)   Oral   Take 0.5 tablets by  mouth at bedtime as needed. For sleep         . levothyroxine (SYNTHROID, LEVOTHROID) 50 MCG tablet   Oral   Take 50 mcg by mouth every other day. Alternate days with synthroid 75 mcg         . levothyroxine (SYNTHROID, LEVOTHROID) 75 MCG tablet   Oral   Take 75 mcg by mouth every other day. Alternate days with synthroid 50 mcg         . oxyCODONE-acetaminophen (PERCOCET/ROXICET) 5-325 MG per tablet      1 to 2 tabs PO q6hrs  PRN for pain   15 tablet   0   . PARoxetine (PAXIL) 30 MG tablet   Oral   Take 30 mg by mouth every evening.          . promethazine (PHENERGAN) 25 MG tablet   Oral   Take 1 tablet (25 mg total) by mouth every 6 (six) hours as needed for nausea.   12 tablet   0   . tamsulosin (FLOMAX) 0.4 MG CAPS capsule   Oral   Take 1 capsule (0.4 mg total) by mouth daily after breakfast.   3 capsule   0   . traZODone (DESYREL) 50  MG tablet   Oral   Take 25 mg by mouth at bedtime as needed for sleep.           BP 178/70  Pulse 78  Temp(Src) 97.9 F (36.6 C)  Resp 20  SpO2 98% Physical Exam  Nursing note and vitals reviewed. Constitutional: She is oriented to person, place, and time. She appears well-developed and well-nourished. She appears distressed.  HENT:  Head: Normocephalic and atraumatic.  Mouth/Throat: Oropharynx is clear and moist.  Eyes: Conjunctivae and EOM are normal. Pupils are equal, round, and reactive to light.  Neck: Normal range of motion. Neck supple.  Cardiovascular: Normal rate, regular rhythm and intact distal pulses.   No murmur heard. Pulmonary/Chest: Effort normal and breath sounds normal. No respiratory distress. She has no wheezes. She has no rales.  Abdominal: Soft. She exhibits no distension. There is tenderness in the left lower quadrant. There is CVA tenderness. There is no rebound and no guarding.  Left flank tenderness  Musculoskeletal: Normal range of motion. She exhibits no edema and no tenderness.  Neurological: She is alert and oriented to person, place, and time.  Skin: Skin is warm and dry. No rash noted. No erythema.  Psychiatric: She has a normal mood and affect. Her behavior is normal.    ED Course  Procedures (including critical care time) Labs Review Labs Reviewed  URINALYSIS, ROUTINE W REFLEX MICROSCOPIC - Abnormal; Notable for the following:    APPearance CLOUDY (*)    Hgb urine dipstick MODERATE (*)    All other components within normal limits  URINE MICROSCOPIC-ADD ON - Abnormal; Notable for the following:    Bacteria, UA FEW (*)    Crystals CA OXALATE CRYSTALS (*)    All other components within normal limits  POCT I-STAT, CHEM 8 - Abnormal; Notable for the following:    Glucose, Bld 113 (*)    Calcium, Ion 1.06 (*)    All other components within normal limits   Imaging Review Ct Abdomen Pelvis Wo Contrast  11/03/2012   CLINICAL DATA:  Right  flank pain.  EXAM: CT ABDOMEN AND PELVIS WITHOUT CONTRAST  TECHNIQUE: Multidetector CT imaging of the abdomen and pelvis was performed following the standard protocol without intravenous contrast.  COMPARISON:  CT 04/30/2002  FINDINGS: Lung bases are clear. No effusions. Heart is normal size.  Scattered hypodensities in the liver, difficult to characterize on this unenhanced study but most likely small cysts. Gallbladder, spleen, pancreas, adrenals and right kidney are unremarkable. There is mild left hydronephrosis. 2 mm stone in the proximal left ureter. 2.3 cm exophytic lesion off the midpole of the left kidney, similar density to the kidney. I cannot exclude a solid renal lesion. When comparing to prior study from 2004, this appears slightly larger. Urinary bladder is decompressed. Uterus and adnexa grossly unremarkable. Appendix is visualized and is normal. Moderate stool throughout the colon. Small bowel is decompressed.  No acute bony abnormality. Postoperative changes in the lumbar spine.  IMPRESSION: 2 mm proximal left ureteral stone with mild left hydronephrosis.  2.3 cm exophytic lesion off the midpole of the left kidney, nonspecific. This is slightly since 2004. This could be further characterized with contrast-enhanced CT or MRI. This should be performed after the obstructive uropathy has resolved.   Electronically Signed   By: Charlett Nose M.D.   On: 11/03/2012 01:48    MDM   1. Kidney stone     Pt dx with KS 2 days ago (2mm stone in the left ureter). Pt doing fine at home till 10pm tonight with worsening pain not controlled with pain and nausea meds. Denies infectious sx, or GI symptoms.  Low concern for diverticulitis and no risk factors or history suggestive of AAA.  No hx suggestive of GU source (discharge) and otherwise pt is healthy.   Will hydrate, treat pain and ensure no infection with UA and normal Cr with i-stat   5:35 AM Pt feeling better after dilaudid.  I-stat with normal Cr  and UA without infection.  Will give toradol.  Gwyneth Sprout, MD 11/04/12 [id number]  Gwyneth Sprout, MD 11/04/12 [phone]

## 2012-11-04 NOTE — ED Notes (Signed)
Pt c/o nausea all night; vomiting; treated last night for same

## 2012-11-11 DIAGNOSIS — N200 Calculus of kidney: Secondary | ICD-10-CM | POA: Diagnosis not present

## 2012-11-14 DIAGNOSIS — Z23 Encounter for immunization: Secondary | ICD-10-CM | POA: Diagnosis not present

## 2012-11-14 DIAGNOSIS — M545 Low back pain, unspecified: Secondary | ICD-10-CM | POA: Diagnosis not present

## 2012-11-14 DIAGNOSIS — M47817 Spondylosis without myelopathy or radiculopathy, lumbosacral region: Secondary | ICD-10-CM | POA: Diagnosis not present

## 2013-01-18 DIAGNOSIS — N390 Urinary tract infection, site not specified: Secondary | ICD-10-CM | POA: Diagnosis not present

## 2013-01-18 DIAGNOSIS — R300 Dysuria: Secondary | ICD-10-CM | POA: Diagnosis not present

## 2013-03-24 DIAGNOSIS — M545 Low back pain, unspecified: Secondary | ICD-10-CM | POA: Diagnosis not present

## 2013-03-24 DIAGNOSIS — M47817 Spondylosis without myelopathy or radiculopathy, lumbosacral region: Secondary | ICD-10-CM | POA: Diagnosis not present

## 2013-04-04 ENCOUNTER — Other Ambulatory Visit: Payer: Self-pay

## 2013-04-04 DIAGNOSIS — Z1231 Encounter for screening mammogram for malignant neoplasm of breast: Secondary | ICD-10-CM

## 2013-04-18 DIAGNOSIS — N390 Urinary tract infection, site not specified: Secondary | ICD-10-CM | POA: Diagnosis not present

## 2013-04-18 DIAGNOSIS — N952 Postmenopausal atrophic vaginitis: Secondary | ICD-10-CM | POA: Diagnosis not present

## 2013-04-25 DIAGNOSIS — H6120 Impacted cerumen, unspecified ear: Secondary | ICD-10-CM | POA: Diagnosis not present

## 2013-04-27 ENCOUNTER — Ambulatory Visit
Admission: RE | Admit: 2013-04-27 | Discharge: 2013-04-27 | Disposition: A | Discharge status: Home / Self Care | Payer: 59 | Source: Ambulatory Visit

## 2013-04-27 DIAGNOSIS — Z1231 Encounter for screening mammogram for malignant neoplasm of breast: Secondary | ICD-10-CM

## 2013-07-04 DIAGNOSIS — M25539 Pain in unspecified wrist: Secondary | ICD-10-CM | POA: Diagnosis not present

## 2013-07-04 DIAGNOSIS — M19049 Primary osteoarthritis, unspecified hand: Secondary | ICD-10-CM | POA: Diagnosis not present

## 2013-07-24 DIAGNOSIS — M47817 Spondylosis without myelopathy or radiculopathy, lumbosacral region: Secondary | ICD-10-CM | POA: Diagnosis not present

## 2013-07-24 DIAGNOSIS — M545 Low back pain, unspecified: Secondary | ICD-10-CM | POA: Diagnosis not present

## 2013-08-25 DIAGNOSIS — M545 Low back pain, unspecified: Secondary | ICD-10-CM | POA: Diagnosis not present

## 2013-08-25 DIAGNOSIS — M47817 Spondylosis without myelopathy or radiculopathy, lumbosacral region: Secondary | ICD-10-CM | POA: Diagnosis not present

## 2013-09-23 IMAGING — RF DG MYELOGRAM LUMBAR
13 of 14 series · 13 of 14 positions shown · non-contrast
Comparison: CT lumbar spine 09/02/2009.  MR lumbar spine
10/27/2006.

CLINICAL DATA: Low back pain and right hip and leg pain extending
to the foot with numbness.  Left leg numbness.  Symptoms worse with
standing. 
LUMBAR MYELOGRAM AND POST MYELOGRAM CT

MYELOGRAM LUMBAR
TECHNIQUE: Contrast was injected by Dr.Leandro Javier at the L1-2 level.
Contrast was negotiated into the lumbar spine without difficulty.
Fluoroscopy time:  1.48 minutes
TECHNIQUE: Multidetector CT imaging of the lumbar spine was
performed following myelography.  Multiplanar CT image
reconstructions were also generated.

[Series 1: run · 1 of 1 slices shown (1 of 13)]
[im 1/1]
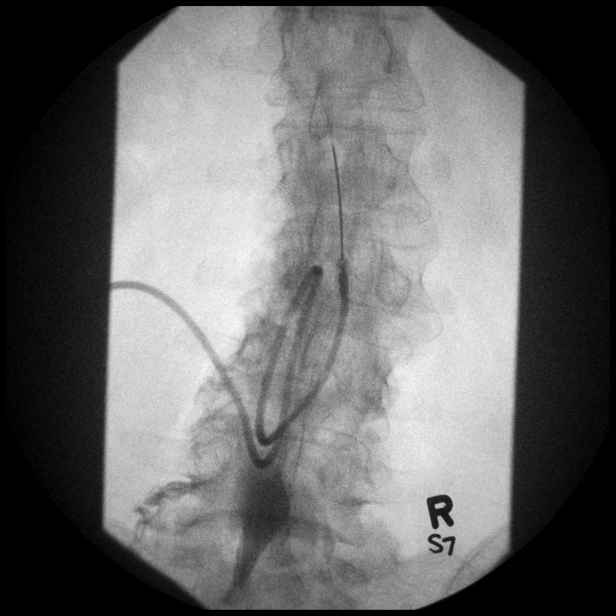

[Series 2: run · 1 of 1 slices shown (2 of 13)]
[im 1/1]
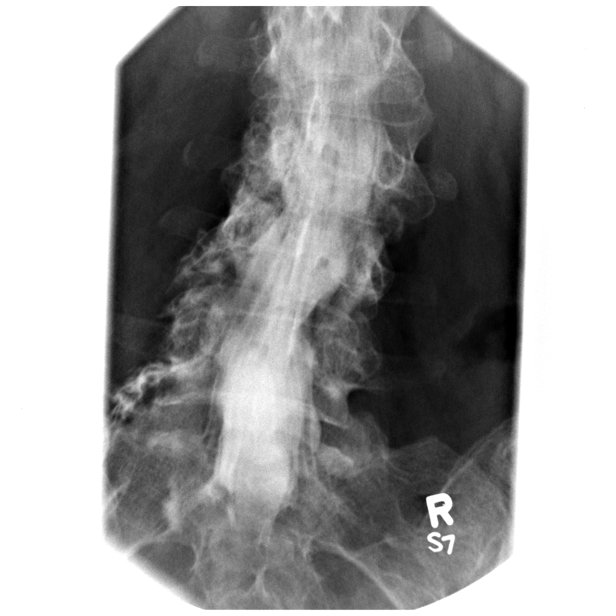

[Series 3: run · 1 of 1 slices shown (3 of 13)]
[im 1/1]
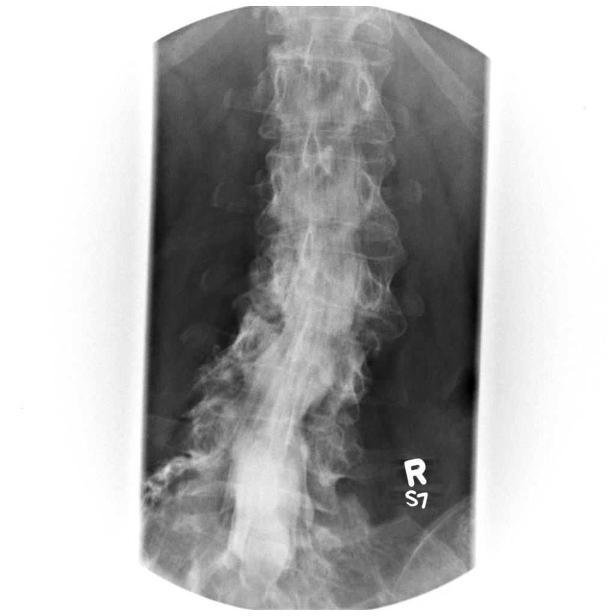

[Series 4: run · 1 of 1 slices shown (4 of 13)]
[im 1/1]
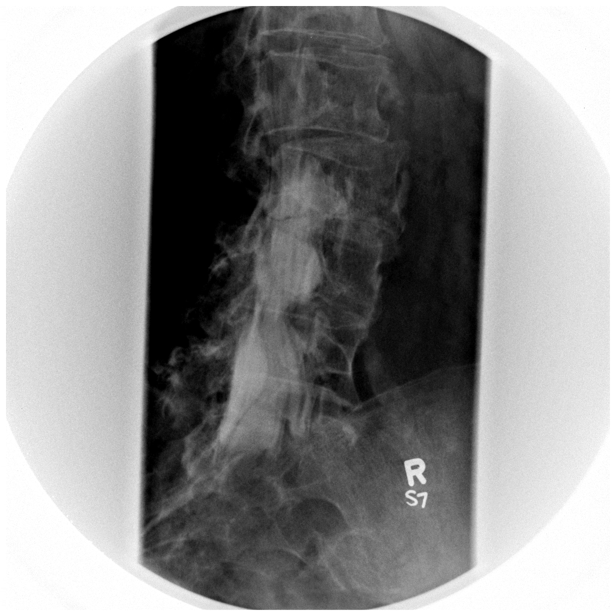

[Series 5: run · 1 of 1 slices shown (5 of 13)]
[im 1/1]
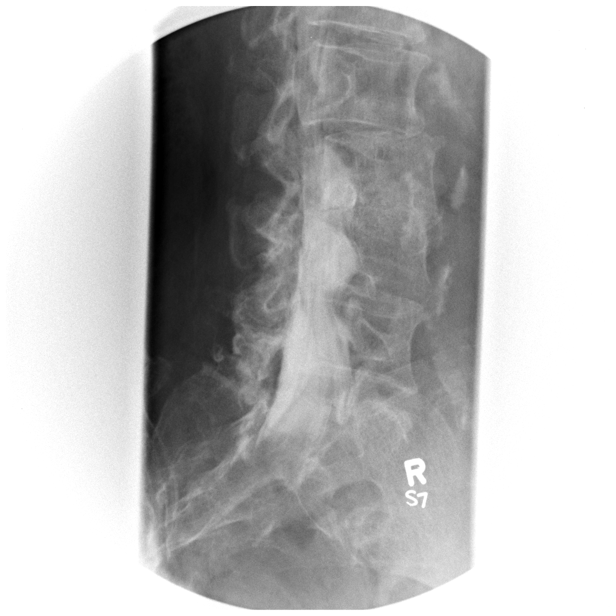

[Series 6: run · 1 of 1 slices shown (6 of 13)]
[im 1/1]
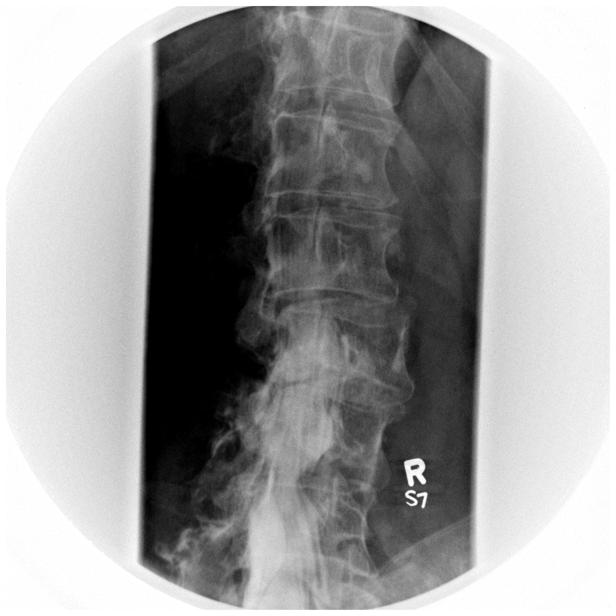

[Series 8: run · 1 of 1 slices shown (7 of 13)]
[im 1/1]
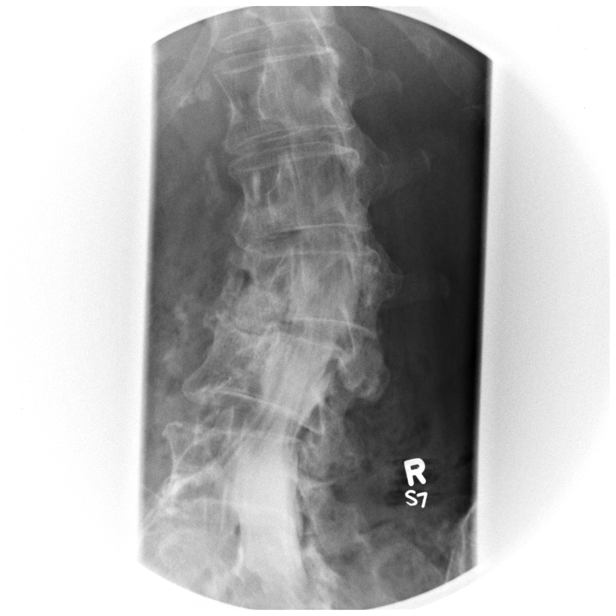

[Series 9: run · 1 of 1 slices shown (8 of 13)]
[im 1/1]
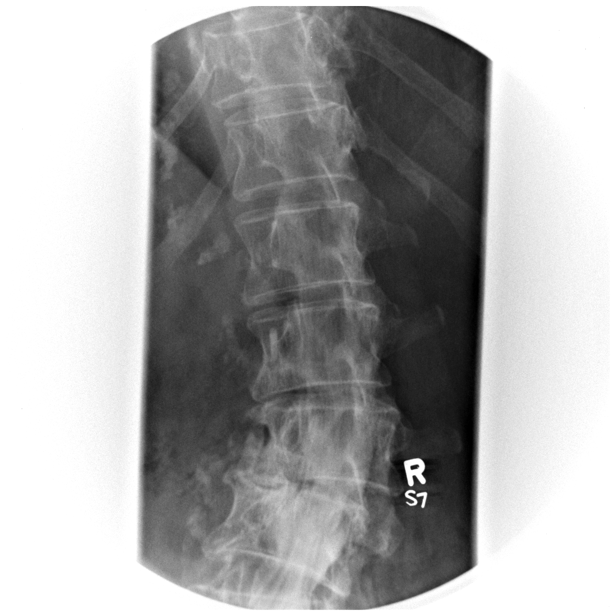

[Series 10: run · 1 of 1 slices shown (9 of 13)]
[im 1/1]
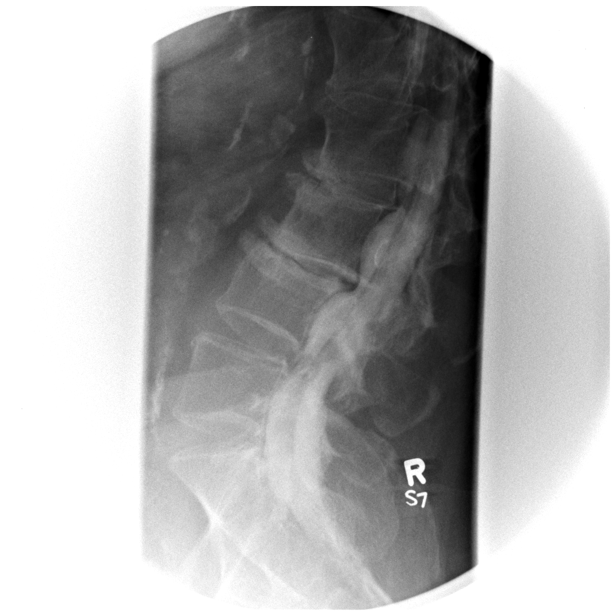

[Series 11: run · 1 of 1 slices shown (10 of 13)]
[im 1/1]
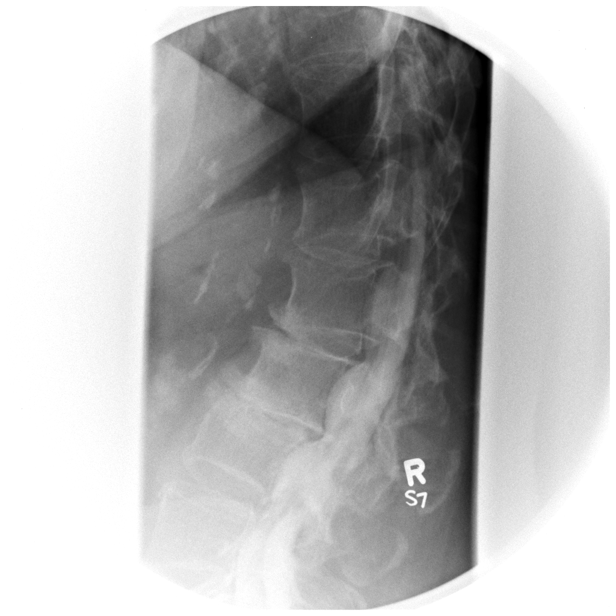

[Series 12: run · 1 of 1 slices shown (11 of 13)]
[im 1/1]
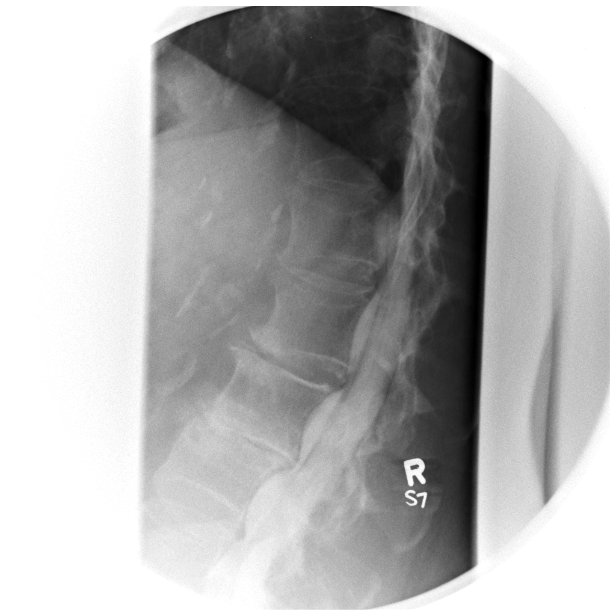

[Series 13: run · 1 of 1 slices shown (12 of 13)]
[im 1/1]
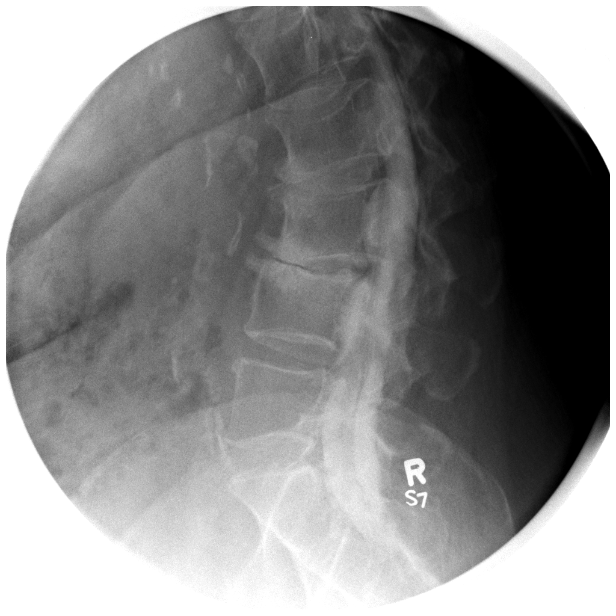

[Series 14: run · 1 of 1 slices shown (13 of 13)]
[im 1/1]
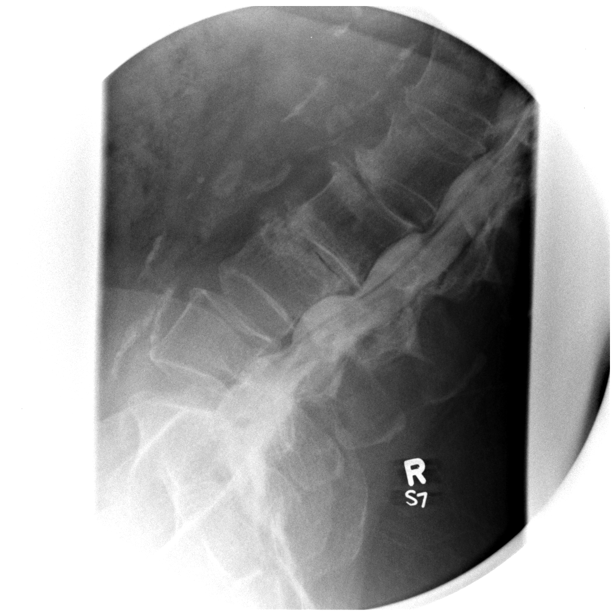

[13 of 14 positions shown; findings below may reference images not displayed]

FINDINGS: Dextroscoliosis of the lumbar spine.

Impression upon the right lateral aspect of the thecal sac at the
L4-5 level (containing the right L5 nerve root).

Mild impression upon the left lateral aspect of thecal sac at the
L3-4 level.
IMPRESSION: Dextroscoliosis of the lumbar spine.

Impression upon the right lateral aspect of the thecal sac at the
L4-5 level (containing the right L5 nerve root).

Mild impression upon the left lateral aspect of thecal sac at the
L3-4 level.

CT MYELOGRAPHY LUMBAR SPINE
FINDINGS: Last fully open disc space is labeled L5-S1.  Present
examination incorporates from T11-12 disc space through the mid
sacrum.

Conus L1 level.

Small sclerotic foci in the right ilium and L1 vertebra unchanged.

2 cm left upper pole exophytic renal lesion cannot be confirmed as
a simple cyst.  Ultrasound recommended for further delineation.

Atherosclerotic type changes of the aorta with ectasia.

Dextroscoliosis of the lumbar spine.

T11-12:  Mild facet joint degenerative changes.  Mild bulge.  Mild
spinal stenosis.

T12-L1:  Negative.

L1-2:  Minimal bulge slightly greater to the right.

L2-3:  Disc space narrowing greater on the left with left lateral
osteophyte.  Retrolisthesis and rotation L2.  Bulge slightly
greater to the left.  Facet joint degenerative changes.  Mild left-
sided spinal stenosis.

L3-4:  Prior left laminectomy.  Prominent disc degeneration disc
space narrowing with surrounding endplate sclerosis and osteophyte
greater on the left.  Rotation and retrolisthesis L3.  Broad-based
disc osteophyte with greater extension to the left.  Foraminal
narrowing greater on the left with slight encroachment upon the
exiting L3 nerve roots, greater on the left without significant
compression.  Mild left lateral recess stenosis/spinal stenosis.

L4-5:  Disc space narrowing greater on the right.  Rotation L4.
Bulge greater to the right.  Mild right lateral recess/spinal
stenosis.

L5-S1:  Bulge slightly greater to the left.  No significant
associated mass effect.
IMPRESSION: Dextroscoliosis.

L2-3 disc space narrowing greater on the left with left lateral
osteophyte.  Retrolisthesis and rotation L2.  Bulge slightly
greater to the left.  Facet joint degenerative changes.  Mild left-
sided spinal stenosis.

L3-4 prior left laminectomy.  Prominent disc degeneration disc
space narrowing with surrounding endplate sclerosis and osteophyte
greater on the left.  Rotation and retrolisthesis L3.  Broad-based
disc osteophyte with greater extension to the left.  Foraminal
narrowing greater on the left with slight encroachment upon the
exiting L3 nerve roots, greater on the left without significant
compression.  Mild left lateral recess stenosis/spinal stenosis.

L4-5 disc space narrowing greater on the right.  Rotation L4.
Bulge greater to the right.  Mild right lateral recess/spinal
stenosis.

2 cm left upper pole exophytic renal lesion cannot be confirmed as
a simple cyst.  Ultrasound recommended for further delineation.

Atherosclerotic type changes of the aorta with ectasia.

## 2013-09-26 DIAGNOSIS — M1710 Unilateral primary osteoarthritis, unspecified knee: Secondary | ICD-10-CM | POA: Diagnosis not present

## 2013-10-26 DIAGNOSIS — Z131 Encounter for screening for diabetes mellitus: Secondary | ICD-10-CM | POA: Diagnosis not present

## 2013-10-26 DIAGNOSIS — Z1331 Encounter for screening for depression: Secondary | ICD-10-CM | POA: Diagnosis not present

## 2013-10-26 DIAGNOSIS — F325 Major depressive disorder, single episode, in full remission: Secondary | ICD-10-CM | POA: Diagnosis not present

## 2013-10-26 DIAGNOSIS — Z136 Encounter for screening for cardiovascular disorders: Secondary | ICD-10-CM | POA: Diagnosis not present

## 2013-10-26 DIAGNOSIS — Z0000 Encounter for general adult medical examination without abnormal findings: Secondary | ICD-10-CM | POA: Diagnosis not present

## 2013-10-26 DIAGNOSIS — Z23 Encounter for immunization: Secondary | ICD-10-CM | POA: Diagnosis not present

## 2013-10-26 DIAGNOSIS — E039 Hypothyroidism, unspecified: Secondary | ICD-10-CM | POA: Diagnosis not present

## 2013-11-07 DIAGNOSIS — L718 Other rosacea: Secondary | ICD-10-CM | POA: Diagnosis not present

## 2013-11-27 DIAGNOSIS — M47817 Spondylosis without myelopathy or radiculopathy, lumbosacral region: Secondary | ICD-10-CM | POA: Diagnosis not present

## 2013-12-18 DIAGNOSIS — M47817 Spondylosis without myelopathy or radiculopathy, lumbosacral region: Secondary | ICD-10-CM | POA: Diagnosis not present

## 2013-12-18 DIAGNOSIS — Z6825 Body mass index (BMI) 25.0-25.9, adult: Secondary | ICD-10-CM | POA: Diagnosis not present

## 2014-01-09 DIAGNOSIS — H6123 Impacted cerumen, bilateral: Secondary | ICD-10-CM | POA: Diagnosis not present

## 2014-02-12 DIAGNOSIS — M47817 Spondylosis without myelopathy or radiculopathy, lumbosacral region: Secondary | ICD-10-CM | POA: Diagnosis not present

## 2014-06-05 DIAGNOSIS — M47817 Spondylosis without myelopathy or radiculopathy, lumbosacral region: Secondary | ICD-10-CM | POA: Diagnosis not present

## 2014-07-03 DIAGNOSIS — Z6825 Body mass index (BMI) 25.0-25.9, adult: Secondary | ICD-10-CM | POA: Diagnosis not present

## 2014-07-03 DIAGNOSIS — M47817 Spondylosis without myelopathy or radiculopathy, lumbosacral region: Secondary | ICD-10-CM | POA: Diagnosis not present

## 2014-08-09 DIAGNOSIS — H26493 Other secondary cataract, bilateral: Secondary | ICD-10-CM | POA: Diagnosis not present

## 2014-08-09 DIAGNOSIS — H04123 Dry eye syndrome of bilateral lacrimal glands: Secondary | ICD-10-CM | POA: Diagnosis not present

## 2014-08-09 DIAGNOSIS — Z961 Presence of intraocular lens: Secondary | ICD-10-CM | POA: Diagnosis not present

## 2014-08-22 DIAGNOSIS — H26493 Other secondary cataract, bilateral: Secondary | ICD-10-CM | POA: Diagnosis not present

## 2014-08-22 DIAGNOSIS — Z961 Presence of intraocular lens: Secondary | ICD-10-CM | POA: Diagnosis not present

## 2014-08-27 DIAGNOSIS — K5900 Constipation, unspecified: Secondary | ICD-10-CM | POA: Diagnosis not present

## 2014-08-28 DIAGNOSIS — M25532 Pain in left wrist: Secondary | ICD-10-CM | POA: Diagnosis not present

## 2014-08-28 DIAGNOSIS — E039 Hypothyroidism, unspecified: Secondary | ICD-10-CM | POA: Diagnosis not present

## 2014-08-29 DIAGNOSIS — H26491 Other secondary cataract, right eye: Secondary | ICD-10-CM | POA: Diagnosis not present

## 2014-08-31 ENCOUNTER — Emergency Department (HOSPITAL_BASED_OUTPATIENT_CLINIC_OR_DEPARTMENT_OTHER)
Admission: EM | Admit: 2014-08-31 | Discharge: 2014-08-31 | Disposition: A | Discharge status: Home / Self Care | Payer: Medicare Other | Attending: Emergency Medicine | Admitting: Emergency Medicine

## 2014-08-31 ENCOUNTER — Emergency Department (HOSPITAL_BASED_OUTPATIENT_CLINIC_OR_DEPARTMENT_OTHER): Payer: Medicare Other

## 2014-08-31 ENCOUNTER — Encounter (HOSPITAL_BASED_OUTPATIENT_CLINIC_OR_DEPARTMENT_OTHER): Payer: Self-pay

## 2014-08-31 DIAGNOSIS — F419 Anxiety disorder, unspecified: Secondary | ICD-10-CM | POA: Insufficient documentation

## 2014-08-31 DIAGNOSIS — S0990XA Unspecified injury of head, initial encounter: Secondary | ICD-10-CM | POA: Diagnosis not present

## 2014-08-31 DIAGNOSIS — Y9389 Activity, other specified: Secondary | ICD-10-CM | POA: Insufficient documentation

## 2014-08-31 DIAGNOSIS — W01198A Fall on same level from slipping, tripping and stumbling with subsequent striking against other object, initial encounter: Secondary | ICD-10-CM | POA: Insufficient documentation

## 2014-08-31 DIAGNOSIS — S0033XA Contusion of nose, initial encounter: Secondary | ICD-10-CM | POA: Diagnosis not present

## 2014-08-31 DIAGNOSIS — S8010XA Contusion of unspecified lower leg, initial encounter: Secondary | ICD-10-CM

## 2014-08-31 DIAGNOSIS — M1990 Unspecified osteoarthritis, unspecified site: Secondary | ICD-10-CM | POA: Diagnosis not present

## 2014-08-31 DIAGNOSIS — M7989 Other specified soft tissue disorders: Secondary | ICD-10-CM | POA: Diagnosis not present

## 2014-08-31 DIAGNOSIS — S8012XA Contusion of left lower leg, initial encounter: Secondary | ICD-10-CM | POA: Diagnosis not present

## 2014-08-31 DIAGNOSIS — Y998 Other external cause status: Secondary | ICD-10-CM | POA: Diagnosis not present

## 2014-08-31 DIAGNOSIS — S0993XA Unspecified injury of face, initial encounter: Secondary | ICD-10-CM | POA: Diagnosis not present

## 2014-08-31 DIAGNOSIS — Z7982 Long term (current) use of aspirin: Secondary | ICD-10-CM | POA: Insufficient documentation

## 2014-08-31 DIAGNOSIS — S0083XA Contusion of other part of head, initial encounter: Secondary | ICD-10-CM | POA: Insufficient documentation

## 2014-08-31 DIAGNOSIS — E039 Hypothyroidism, unspecified: Secondary | ICD-10-CM | POA: Insufficient documentation

## 2014-08-31 DIAGNOSIS — Z87442 Personal history of urinary calculi: Secondary | ICD-10-CM | POA: Diagnosis not present

## 2014-08-31 DIAGNOSIS — W19XXXA Unspecified fall, initial encounter: Secondary | ICD-10-CM

## 2014-08-31 DIAGNOSIS — R51 Headache: Secondary | ICD-10-CM | POA: Diagnosis not present

## 2014-08-31 DIAGNOSIS — S8001XA Contusion of right knee, initial encounter: Secondary | ICD-10-CM | POA: Diagnosis not present

## 2014-08-31 DIAGNOSIS — F329 Major depressive disorder, single episode, unspecified: Secondary | ICD-10-CM | POA: Diagnosis not present

## 2014-08-31 DIAGNOSIS — Z79899 Other long term (current) drug therapy: Secondary | ICD-10-CM | POA: Diagnosis not present

## 2014-08-31 DIAGNOSIS — Y92003 Bedroom of unspecified non-institutional (private) residence as the place of occurrence of the external cause: Secondary | ICD-10-CM | POA: Diagnosis not present

## 2014-08-31 DIAGNOSIS — Z87891 Personal history of nicotine dependence: Secondary | ICD-10-CM | POA: Diagnosis not present

## 2014-08-31 HISTORY — DX: Gout, unspecified: M10.9

## 2014-08-31 MED ORDER — HYDROCODONE-ACETAMINOPHEN 5-325 MG PO TABS: 1.0000 | ORAL_TABLET | ORAL | Status: DC | PRN

## 2014-08-31 MED ORDER — ACETAMINOPHEN 500 MG PO TABS
1000.0000 mg | ORAL_TABLET | Freq: Once | ORAL | Status: AC
  Administered 2014-08-31: 1000 mg via ORAL
  Filled 2014-08-31: qty 2

## 2014-08-31 NOTE — ED Notes (Signed)
Pt states fell and hit face area, denies any HA, N/V, denies LOC

## 2014-08-31 NOTE — ED Notes (Signed)
Patient transported to CT via stretcher, sr x 2 up  

## 2014-08-31 NOTE — ED Notes (Signed)
MD at bedside. 

## 2014-08-31 NOTE — ED Provider Notes (Signed)
CSN: [id number]     Arrival date & time 08/31/14  1707 History   First MD Initiated Contact with Patient 08/31/14 1712     Chief Complaint  Patient presents with  . Fall     (Consider location/radiation/quality/duration/timing/severity/associated sxs/prior Treatment) HPI Patient tripped over a bed frame in her basement. This caused her to fall forward hitting her face on the air conditioning unit. She developed swelling over her nose and a lot of bruising. The patient poor she was dazed but did not have a loss of consciousness. She also reports bruising on her lower legs and her right knee. She reports she was able to walk without difficulty and does not think that anything is broken. Not on blood thinners. No chest pain, shortness of breath or abdominal pain. Past Medical History  Diagnosis Date  . Hypothyroidism     treatment for Graves disease  . Depression   . Anxiety     panic attack many yrs. ago   . Arthritis     OA- hands & knees, lumbar stenosis, radiculopathy   . Renal calculi     x3 episodes   . Gout    Past Surgical History  Procedure Laterality Date  . Back surgery      2009, diskectomy lumbar   . Hemorroidectomy      44 yrs. ago  . Tonsillectomy      as a child   . Eye surgery      cataracts removed bilateral - /w IOL   . Anterior lat lumbar fusion  05/04/2011    Procedure: ANTERIOR LATERAL LUMBAR FUSION 3 LEVELS;  Surgeon: Kristeen Miss, MD;  Location: [facility];  Service: Neurosurgery;  Laterality: N/A;  Lumbar Two-Three, Lumbar Three-Four, Lumbar Four-Five Anterolateral decompression/arthrodesis.   Family History  Problem Relation Age of Onset  . Anesthesia problems Neg Hx    History  Substance Use Topics  . Smoking status: Former Smoker    Quit date: 04/28/1988  . Smokeless tobacco: Not on file  . Alcohol Use: Yes     Comment: occas. glass of wine    OB History    No data available     Review of Systems  10 Systems reviewed and are negative  for acute change except as noted in the HPI.   Allergies  Review of patient's allergies indicates no known allergies.  Home Medications   Prior to Admission medications   Medication Sig Start Date End Date Taking? Authorizing Provider  aspirin EC 81 MG tablet Take 81 mg by mouth at bedtime.     Historical Provider, MD  calcium carbonate (TUMS - DOSED IN MG ELEMENTAL CALCIUM) 500 MG chewable tablet Chew 2 tablets by mouth 2 (two) times daily.    Historical Provider, MD  cholecalciferol (VITAMIN D) 1000 UNITS tablet Take 1,000 Units by mouth daily.    Historical Provider, MD  Doxylamine Succinate, Sleep, (SLEEP AID PO) Take 0.5 tablets by mouth at bedtime as needed. For sleep    Historical Provider, MD  HYDROcodone-acetaminophen (NORCO/VICODIN) 5-325 MG per tablet Take 1-2 tablets by mouth every 4 (four) hours as needed for moderate pain or severe pain. 08/31/14   Charlesetta Shanks, MD  levothyroxine (SYNTHROID, LEVOTHROID) 50 MCG tablet Take 50 mcg by mouth every other day. Alternate days with synthroid 75 mcg    Historical Provider, MD  levothyroxine (SYNTHROID, LEVOTHROID) 75 MCG tablet Take 75 mcg by mouth every other day. Alternate days with synthroid 50 mcg  Historical Provider, MD  oxyCODONE-acetaminophen (PERCOCET/ROXICET) 5-325 MG per tablet 1 to 2 tabs PO q6hrs  PRN for pain 11/03/12   Elmyra Ricks Pisciotta, PA-C  PARoxetine (PAXIL) 30 MG tablet Take 30 mg by mouth every evening.     Historical Provider, MD  promethazine (PHENERGAN) 25 MG tablet Take 1 tablet (25 mg total) by mouth every 6 (six) hours as needed for nausea. 11/03/12   Nicole Pisciotta, PA-C  tamsulosin (FLOMAX) 0.4 MG CAPS capsule Take 1 capsule (0.4 mg total) by mouth daily after breakfast. 11/03/12   Elmyra Ricks Pisciotta, PA-C  traZODone (DESYREL) 50 MG tablet Take 25 mg by mouth at bedtime as needed for sleep.     Historical Provider, MD   BP 184/94 mmHg  Pulse 94  Temp(Src) 97.7 F (36.5 C)  Resp 22  Ht 5\' 6"  (1.676 m)  Wt  155 lb (70.308 kg)  BMI 25.03 kg/m2  SpO2 100% Physical Exam  Constitutional: She is oriented to person, place, and time. She appears well-developed and well-nourished.  HENT:  Hematoma formation over the nasal bridge and the glabellar region. This is symmetric. Hematomas spreading down over the medial aspect of the zygoma on the right. Extraocular motions are intact. No epistaxis. Range of motion of jaw is intact. Right TM is. By cerumen, left TM is normal without hemotympanum.  Eyes: EOM are normal. Pupils are equal, round, and reactive to light.  Neck: Neck supple.  No cervical spine tenderness.  Cardiovascular: Normal rate, regular rhythm, normal heart sounds and intact distal pulses.   Pulmonary/Chest: Effort normal and breath sounds normal. She exhibits no tenderness.  Abdominal: Soft. Bowel sounds are normal. She exhibits no distension. There is no tenderness.  Musculoskeletal: Normal range of motion. She exhibits no edema.  Hematoma to the left anterior tibia. Hematoma right knee. No effusion of the joints. Normal range of motion at the hip and knee and ankle.  Neurological: She is alert and oriented to person, place, and time. She has normal strength. Coordination normal. GCS eye subscore is 4. GCS verbal subscore is 5. GCS motor subscore is 6.  Skin: Skin is warm, dry and intact.  Psychiatric: She has a normal mood and affect.    ED Course  Procedures (including critical care time) Labs Review Labs Reviewed - No data to display  Imaging Review Ct Head Wo Contrast  08/31/2014   CLINICAL DATA:  74 year old female with fall, facial and head injury, headache and facial pain and swelling. Initial encounter.  EXAM: CT HEAD WITHOUT CONTRAST  CT MAXILLOFACIAL WITHOUT CONTRAST  TECHNIQUE: Multidetector CT imaging of the head and maxillofacial structures were performed using the standard protocol without intravenous contrast. Multiplanar CT image reconstructions of the maxillofacial  structures were also generated.  COMPARISON:  None.  FINDINGS: CT HEAD FINDINGS  Mild chronic small-vessel white matter ischemic changes are noted.  No acute intracranial abnormalities are identified, including mass lesion or mass effect, hydrocephalus, extra-axial fluid collection, midline shift, hemorrhage, or acute infarction. The visualized bony calvarium is unremarkable.  CT MAXILLOFACIAL FINDINGS  Nasal soft tissue swelling is noted. There is no evidence of acute fracture, subluxation or dislocation.  Paranasal sinuses, mastoid air cells and middle/ inner ears are clear.  The orbits and globes are unremarkable.  No focal bony lesions are identified.  IMPRESSION: No evidence of acute intracranial abnormality.  Mild facial soft tissue swelling without acute bony abnormality.  Mild chronic small-vessel white matter ischemic changes.   Electronically Signed   By: Dellis Filbert  Hu M.D.   On: 08/31/2014 17:52   Ct Maxillofacial Wo Cm  08/31/2014   CLINICAL DATA:  74 year old female with fall, facial and head injury, headache and facial pain and swelling. Initial encounter.  EXAM: CT HEAD WITHOUT CONTRAST  CT MAXILLOFACIAL WITHOUT CONTRAST  TECHNIQUE: Multidetector CT imaging of the head and maxillofacial structures were performed using the standard protocol without intravenous contrast. Multiplanar CT image reconstructions of the maxillofacial structures were also generated.  COMPARISON:  None.  FINDINGS: CT HEAD FINDINGS  Mild chronic small-vessel white matter ischemic changes are noted.  No acute intracranial abnormalities are identified, including mass lesion or mass effect, hydrocephalus, extra-axial fluid collection, midline shift, hemorrhage, or acute infarction. The visualized bony calvarium is unremarkable.  CT MAXILLOFACIAL FINDINGS  Nasal soft tissue swelling is noted. There is no evidence of acute fracture, subluxation or dislocation.  Paranasal sinuses, mastoid air cells and middle/ inner ears are clear.   The orbits and globes are unremarkable.  No focal bony lesions are identified.  IMPRESSION: No evidence of acute intracranial abnormality.  Mild facial soft tissue swelling without acute bony abnormality.  Mild chronic small-vessel white matter ischemic changes.   Electronically Signed   By: Margarette Canada M.D.   On: 08/31/2014 17:52     EKG Interpretation None      MDM   Final diagnoses:  Fall, initial encounter  Hematoma of face, initial encounter  Hematoma of lower extremity, unspecified laterality, initial encounter   Patient has large mid facial swelling due to hematoma. CT does not show underlying facial fracture or intracranial injury. Patient's extraocular motions are normal and she has no visual complaints. All neurologic function is normal and intact. Patient will be on Vicodin take if needed for pain. Head injury instructions are provided. Patient is discharged with her husband for ongoing home observation.    Charlesetta Shanks, MD 08/31/14 [phone]

## 2014-08-31 NOTE — ED Notes (Signed)
Pt reports mechanical fall while assist with moving a bed, hit face on furnace, no loc.  Not on blood thinners.

## 2014-08-31 NOTE — ED Notes (Signed)
Ice pack to rt knee also

## 2014-08-31 NOTE — Discharge Instructions (Signed)
Head Injury °You have received a head injury. It does not appear serious at this time. Headaches and vomiting are common following head injury. It should be easy to awaken from sleeping. Sometimes it is necessary for you to stay in the emergency department for a while for observation. Sometimes admission to the hospital may be needed. After injuries such as yours, most problems occur within the first 24 hours, but side effects may occur up to 7-10 days after the injury. It is important for you to carefully monitor your condition and contact your health care provider or seek immediate medical care if there is a change in your condition. °WHAT ARE THE TYPES OF HEAD INJURIES? °Head injuries can be as minor as a bump. Some head injuries can be more severe. More severe head injuries include: °· A jarring injury to the brain (concussion). °· A bruise of the brain (contusion). This mean there is bleeding in the brain that can cause swelling. °· A cracked skull (skull fracture). °· Bleeding in the brain that collects, clots, and forms a bump (hematoma). °WHAT CAUSES A HEAD INJURY? °A serious head injury is most likely to happen to someone who is in a car wreck and is not wearing a seat belt. Other causes of major head injuries include bicycle or motorcycle accidents, sports injuries, and falls. °HOW ARE HEAD INJURIES DIAGNOSED? °A complete history of the event leading to the injury and your current symptoms will be helpful in diagnosing head injuries. Many times, pictures of the brain, such as CT or MRI are needed to see the extent of the injury. Often, an overnight hospital stay is necessary for observation.  °WHEN SHOULD I SEEK IMMEDIATE MEDICAL CARE?  °You should get help right away if: °· You have confusion or drowsiness. °· You feel sick to your stomach (nauseous) or have continued, forceful vomiting. °· You have dizziness or unsteadiness that is getting worse. °· You have severe, continued headaches not relieved by  medicine. Only take over-the-counter or prescription medicines for pain, fever, or discomfort as directed by your health care provider. °· You do not have normal function of the arms or legs or are unable to walk. °· You notice changes in the black spots in the center of the colored part of your eye (pupil). °· You have a clear or bloody fluid coming from your nose or ears. °· You have a loss of vision. °During the next 24 hours after the injury, you must stay with someone who can watch you for the warning signs. This person should contact local emergency services (911 in the [place] if you have seizures, you become unconscious, or you are unable to wake up. °HOW CAN I PREVENT A HEAD INJURY IN THE FUTURE? °The most important factor for preventing major head injuries is avoiding motor vehicle accidents.  To minimize the potential for damage to your head, it is crucial to wear seat belts while riding in motor vehicles. Wearing helmets while bike riding and playing collision sports (like football) is also helpful. Also, avoiding dangerous activities around the house will further help reduce your risk of head injury.  °WHEN CAN I RETURN TO NORMAL ACTIVITIES AND ATHLETICS? °You should be reevaluated by your health care provider before returning to these activities. If you have any of the following symptoms, you should not return to activities or contact sports until 1 week after the symptoms have stopped: °· Persistent headache. °· Dizziness or vertigo. °· Poor attention and concentration. °· Confusion. °·   Memory problems.  Nausea or vomiting.  Fatigue or tire easily.  Irritability.  Intolerant of bright lights or loud noises.  Anxiety or depression.  Disturbed sleep. MAKE SURE YOU:   Understand these instructions.  Will watch your condition.  Will get help right away if you are not doing well or get worse. Document Released: 01/19/2005 Document Revised: 01/24/2013 Document Reviewed:  09/26/2012 [facility] Patient Information 2015 [place], [place]. This information is not intended to replace advice given to you by your health care provider. Make sure you discuss any questions you have with your health care provider. Hematoma A hematoma is a collection of blood under the skin, in an organ, in a body space, in a joint space, or in other tissue. The blood can clot to form a lump that you can see and feel. The lump is often firm and may sometimes become sore and tender. Most hematomas get better in a few days to weeks. However, some hematomas may be serious and require medical care. Hematomas can range in size from very small to very large. CAUSES  A hematoma can be caused by a blunt or penetrating injury. It can also be caused by spontaneous leakage from a blood vessel under the skin. Spontaneous leakage from a blood vessel is more likely to occur in older people, especially those taking blood thinners. Sometimes, a hematoma can develop after certain medical procedures. SIGNS AND SYMPTOMS   A firm lump on the body.  Possible pain and tenderness in the area.  Bruising.Blue, dark blue, purple-red, or yellowish skin may appear at the site of the hematoma if the hematoma is close to the surface of the skin. For hematomas in deeper tissues or body spaces, the signs and symptoms may be subtle. For example, an intra-abdominal hematoma may cause abdominal pain, weakness, fainting, and shortness of breath. An intracranial hematoma may cause a headache or symptoms such as weakness, trouble speaking, or a change in consciousness. DIAGNOSIS  A hematoma can usually be diagnosed based on your medical history and a physical exam. Imaging tests may be needed if your health care provider suspects a hematoma in deeper tissues or body spaces, such as the abdomen, head, or chest. These tests may include ultrasonography or a CT scan.  TREATMENT  Hematomas usually go away on their own over time. Rarely  does the blood need to be drained out of the body. Large hematomas or those that may affect vital organs will sometimes need surgical drainage or monitoring. HOME CARE INSTRUCTIONS   Apply ice to the injured area:   Put ice in a plastic bag.   Place a towel between your skin and the bag.   Leave the ice on for 20 minutes, 2-3 times a day for the first 1 to 2 days.   After the first 2 days, switch to using warm compresses on the hematoma.   Elevate the injured area to help decrease pain and swelling. Wrapping the area with an elastic bandage may also be helpful. Compression helps to reduce swelling and promotes shrinking of the hematoma. Make sure the bandage is not wrapped too tight.   If your hematoma is on a lower extremity and is painful, crutches may be helpful for a couple days.   Only take over-the-counter or prescription medicines as directed by your health care provider. SEEK IMMEDIATE MEDICAL CARE IF:   You have increasing pain, or your pain is not controlled with medicine.   You have a fever.   You  have worsening swelling or discoloration.   Your skin over the hematoma breaks or starts bleeding.   Your hematoma is in your chest or abdomen and you have weakness, shortness of breath, or a change in consciousness.  Your hematoma is on your scalp (caused by a fall or injury) and you have a worsening headache or a change in alertness or consciousness. MAKE SURE YOU:   Understand these instructions.  Will watch your condition.  Will get help right away if you are not doing well or get worse. Document Released: 09/03/2003 Document Revised: 09/21/2012 Document Reviewed: 06/29/2012 [facility] Patient Information 2015 [place], [place]. This information is not intended to replace advice given to you by your health care provider. Make sure you discuss any questions you have with your health care provider.

## 2014-09-11 DIAGNOSIS — S0990XA Unspecified injury of head, initial encounter: Secondary | ICD-10-CM | POA: Diagnosis not present

## 2014-09-11 DIAGNOSIS — M25561 Pain in right knee: Secondary | ICD-10-CM | POA: Diagnosis not present

## 2014-09-20 DIAGNOSIS — D120 Benign neoplasm of cecum: Secondary | ICD-10-CM | POA: Diagnosis not present

## 2014-09-20 DIAGNOSIS — D126 Benign neoplasm of colon, unspecified: Secondary | ICD-10-CM | POA: Diagnosis not present

## 2014-09-20 DIAGNOSIS — K5730 Diverticulosis of large intestine without perforation or abscess without bleeding: Secondary | ICD-10-CM | POA: Diagnosis not present

## 2014-09-20 DIAGNOSIS — Z86010 Personal history of colonic polyps: Secondary | ICD-10-CM | POA: Diagnosis not present

## 2014-09-20 DIAGNOSIS — Z09 Encounter for follow-up examination after completed treatment for conditions other than malignant neoplasm: Secondary | ICD-10-CM | POA: Diagnosis not present

## 2014-09-20 DIAGNOSIS — D122 Benign neoplasm of ascending colon: Secondary | ICD-10-CM | POA: Diagnosis not present

## 2014-10-09 DIAGNOSIS — H6120 Impacted cerumen, unspecified ear: Secondary | ICD-10-CM | POA: Diagnosis not present

## 2014-10-12 ENCOUNTER — Other Ambulatory Visit: Payer: Self-pay

## 2014-10-12 DIAGNOSIS — Z1231 Encounter for screening mammogram for malignant neoplasm of breast: Secondary | ICD-10-CM

## 2014-10-19 ENCOUNTER — Ambulatory Visit
Admission: RE | Admit: 2014-10-19 | Discharge: 2014-10-19 | Disposition: A | Discharge status: Home / Self Care | Payer: Medicare Other | Source: Ambulatory Visit

## 2014-10-19 DIAGNOSIS — Z1231 Encounter for screening mammogram for malignant neoplasm of breast: Secondary | ICD-10-CM | POA: Diagnosis not present

## 2014-10-31 DIAGNOSIS — Z1389 Encounter for screening for other disorder: Secondary | ICD-10-CM | POA: Diagnosis not present

## 2014-10-31 DIAGNOSIS — E039 Hypothyroidism, unspecified: Secondary | ICD-10-CM | POA: Diagnosis not present

## 2014-10-31 DIAGNOSIS — Z23 Encounter for immunization: Secondary | ICD-10-CM | POA: Diagnosis not present

## 2014-10-31 DIAGNOSIS — F325 Major depressive disorder, single episode, in full remission: Secondary | ICD-10-CM | POA: Diagnosis not present

## 2014-12-03 DIAGNOSIS — M47817 Spondylosis without myelopathy or radiculopathy, lumbosacral region: Secondary | ICD-10-CM | POA: Diagnosis not present

## 2014-12-03 DIAGNOSIS — N952 Postmenopausal atrophic vaginitis: Secondary | ICD-10-CM | POA: Diagnosis not present

## 2015-01-10 DIAGNOSIS — M47817 Spondylosis without myelopathy or radiculopathy, lumbosacral region: Secondary | ICD-10-CM | POA: Diagnosis not present

## 2015-11-01 ENCOUNTER — Other Ambulatory Visit: Payer: Self-pay | Admitting: Internal Medicine

## 2015-11-01 DIAGNOSIS — Z1231 Encounter for screening mammogram for malignant neoplasm of breast: Secondary | ICD-10-CM

## 2015-12-04 ENCOUNTER — Ambulatory Visit: Payer: Medicare Other

## 2015-12-18 ENCOUNTER — Ambulatory Visit
Admission: RE | Admit: 2015-12-18 | Discharge: 2015-12-18 | Disposition: A | Discharge status: Home / Self Care | Payer: Medicare Other | Source: Ambulatory Visit | Attending: Internal Medicine | Admitting: Internal Medicine

## 2015-12-18 DIAGNOSIS — Z1231 Encounter for screening mammogram for malignant neoplasm of breast: Secondary | ICD-10-CM

## 2016-11-30 ENCOUNTER — Other Ambulatory Visit: Payer: Self-pay | Admitting: Internal Medicine

## 2016-11-30 DIAGNOSIS — Z1231 Encounter for screening mammogram for malignant neoplasm of breast: Secondary | ICD-10-CM

## 2016-12-29 ENCOUNTER — Ambulatory Visit
Admission: RE | Admit: 2016-12-29 | Discharge: 2016-12-29 | Disposition: A | Discharge status: Home / Self Care | Payer: Medicare Other | Source: Ambulatory Visit | Attending: Internal Medicine | Admitting: Internal Medicine

## 2016-12-29 DIAGNOSIS — Z1231 Encounter for screening mammogram for malignant neoplasm of breast: Secondary | ICD-10-CM

## 2018-02-08 ENCOUNTER — Ambulatory Visit: Payer: Medicare Other | Admitting: Diagnostic Neuroimaging

## 2018-03-14 ENCOUNTER — Encounter: Payer: Self-pay | Admitting: Diagnostic Neuroimaging

## 2018-03-15 ENCOUNTER — Ambulatory Visit: Payer: Medicare Other | Admitting: Diagnostic Neuroimaging

## 2018-03-15 ENCOUNTER — Encounter: Payer: Self-pay | Admitting: Diagnostic Neuroimaging

## 2018-03-15 DIAGNOSIS — H532 Diplopia: Secondary | ICD-10-CM

## 2018-03-15 NOTE — Progress Notes (Signed)
[facility]  PATIENT: Kerry Ramsey DOB: 1941-01-10  REFERRING CLINICIAN: Groat HISTORY FROM: patient  REASON FOR VISIT: new consult   HISTORICAL  CHIEF COMPLAINT:  Chief Complaint  Patient presents with  . Diplopia    rm 7, New Pt, "I noticed double vision 6 months ago, saw Dr Katy Fitch who did labs and sent me here"    HISTORY OF PRESENT ILLNESS:   78 year old female here for evaluation of double vision.  Patient has history of depression, anxiety, Graves' thyroid disease.  August 2019 patient noted double vision on the right side.  She went to eye doctor was diagnosed with right 6th nerve palsy.  Patient referred here for further evaluation.  No slurred speech, trouble talking, chewing issues or breathing problems.  No extremity weakness or numbness.   REVIEW OF SYSTEMS: Full 14 system review of systems performed and negative with exception of: Sleepiness tremor depression anxiety snoring hearing loss double vision constipation runny nose.  ALLERGIES: No Known Allergies  HOME MEDICATIONS: Outpatient Medications Prior to Visit  Medication Sig Dispense Refill  . calcium carbonate (TUMS - DOSED IN MG ELEMENTAL CALCIUM) 500 MG chewable tablet Chew 2 tablets by mouth 2 (two) times daily.    . Doxylamine Succinate, Sleep, (SLEEP AID PO) Take 0.5 tablets by mouth at bedtime as needed. For sleep    . levothyroxine (SYNTHROID, LEVOTHROID) 50 MCG tablet Take 50 mcg by mouth every other day. Alternate days with synthroid 75 mcg    . PARoxetine (PAXIL) 20 MG tablet 20 mg daily.    Marland Kitchen PARoxetine (PAXIL) 30 MG tablet Take 30 mg by mouth every evening.     Marland Kitchen aspirin EC 81 MG tablet Take 81 mg by mouth at bedtime.     . cholecalciferol (VITAMIN D) 1000 UNITS tablet Take 1,000 Units by mouth daily.    Marland Kitchen HYDROcodone-acetaminophen (NORCO/VICODIN) 5-325 MG per tablet Take 1-2 tablets by mouth every 4 (four) hours as needed for moderate pain or severe pain. 20 tablet 0   . levothyroxine (SYNTHROID, LEVOTHROID) 75 MCG tablet Take 75 mcg by mouth every other day. Alternate days with synthroid 50 mcg    . oxyCODONE-acetaminophen (PERCOCET/ROXICET) 5-325 MG per tablet 1 to 2 tabs PO q6hrs  PRN for pain 15 tablet 0  . promethazine (PHENERGAN) 25 MG tablet Take 1 tablet (25 mg total) by mouth every 6 (six) hours as needed for nausea. 12 tablet 0  . tamsulosin (FLOMAX) 0.4 MG CAPS capsule Take 1 capsule (0.4 mg total) by mouth daily after breakfast. 3 capsule 0  . traZODone (DESYREL) 50 MG tablet Take 25 mg by mouth at bedtime as needed for sleep.      No facility-administered medications prior to visit.     PAST MEDICAL HISTORY: Past Medical History:  Diagnosis Date  . Anxiety    panic attack many yrs. ago   . Arthritis    OA- hands & knees, lumbar stenosis, radiculopathy   . Depression   . Gout   . Hypothyroidism    treatment for Graves disease  . Renal calculi    x3 episodes     PAST SURGICAL HISTORY: Past Surgical History:  Procedure Laterality Date  . ANTERIOR LAT LUMBAR FUSION  05/04/2011   Procedure: ANTERIOR LATERAL LUMBAR FUSION 3 LEVELS;  Surgeon: Kristeen Miss, MD;  Location: [facility];  Service: Neurosurgery;  Laterality: N/A;  Lumbar Two-Three, Lumbar Three-Four, Lumbar Four-Five Anterolateral decompression/arthrodesis.  Marland Kitchen BACK SURGERY  2009, diskectomy lumbar   . DILATION AND CURETTAGE OF UTERUS  1967  . EYE SURGERY     cataracts removed bilateral - /w IOL   . HEMORROIDECTOMY     45 yrs. ago  . TONSILLECTOMY     as a child     FAMILY HISTORY: Family History  Problem Relation Age of Onset  . Cancer Father        throat  . Cancer Brother        esophagus  . Anesthesia problems Neg Hx   . Breast cancer Neg Hx     SOCIAL HISTORY: Social History   Socioeconomic History  . Marital status: Married    Spouse name: Not on file  . Number of children: 1  . Years of education: Not on file  . Highest education level:  Associate degree: occupational, Hotel manager, or vocational program  Occupational History    Comment: retired Therapist, sports  Social Needs  . Financial resource strain: Not on file  . Food insecurity:    Worry: Not on file    Inability: Not on file  . Transportation needs:    Medical: Not on file    Non-medical: Not on file  Tobacco Use  . Smoking status: Former Smoker    Last attempt to quit: 04/29/1994    Years since quitting: 23.8  . Smokeless tobacco: Never Used  Substance and Sexual Activity  . Alcohol use: Yes    Comment:  glass of wine 3x wk  . Drug use: No  . Sexual activity: Not on file  Lifestyle  . Physical activity:    Days per week: Not on file    Minutes per session: Not on file  . Stress: Not on file  Relationships  . Social connections:    Talks on phone: Not on file    Gets together: Not on file    Attends religious service: Not on file    Active member of club or organization: Not on file    Attends meetings of clubs or organizations: Not on file    Relationship status: Not on file  . Intimate partner violence:    Fear of current or ex partner: Not on file    Emotionally abused: Not on file    Physically abused: Not on file    Forced sexual activity: Not on file  Other Topics Concern  . Not on file  Social History Narrative   Lives with husband   Caffeine- coffee, 4 cups daily     PHYSICAL EXAM  GENERAL EXAM/CONSTITUTIONAL: Vitals:  Vitals:   03/15/18 1413  BP: 130/72  Pulse: 84  Weight: 163 lb 12.8 oz (74.3 kg)  Height: '5\' 5"'$  (1.651 m)     Body mass index is 27.26 kg/m. Wt Readings from Last 3 Encounters:  03/15/18 163 lb 12.8 oz (74.3 kg)  08/31/14 155 lb (70.3 kg)  07/10/12 152 lb (68.9 kg)     Patient is in no distress; well developed, nourished and groomed; neck is supple  CARDIOVASCULAR:  Examination of carotid arteries is normal; no carotid bruits  Regular rate and rhythm, no murmurs  Examination of peripheral vascular system by  observation and palpation is normal  EYES:  Ophthalmoscopic exam of optic discs and posterior segments is normal; no papilledema or hemorrhages  Visual Acuity Screening   Right eye Left eye Both eyes  Without correction:     With correction: 20/40 20/40      MUSCULOSKELETAL:  Gait, strength,  tone, movements noted in Neurologic exam below  NEUROLOGIC: MENTAL STATUS:  No flowsheet data found.  awake, alert, oriented to person, place and time  recent and remote memory intact  normal attention and concentration  language fluent, comprehension intact, naming intact  fund of knowledge appropriate  CRANIAL NERVE:   2nd - no papilledema on fundoscopic exam  2nd, 3rd, 4th, 6th - pupils equal and reactive to light, visual fields full to confrontation, extraocular muscles intact, no nystagmus; DECR ABDUCTION OF RIGHT EYE ON RIGHT GAZE WITH SUBJECTIVE BINOCULAR DOUBLE VISION  5th - facial sensation symmetric  7th - facial strength symmetric  8th - hearing intact  9th - palate elevates symmetrically, uvula midline  11th - shoulder shrug symmetric  12th - tongue protrusion midline  MOUTH TREMOR / HEAD TREMOR  MOTOR:   normal bulk and tone, full strength in the BUE, BLE  MILD POSTURAL TREMOR IN BUE  SENSORY:   normal and symmetric to light touch, temperature, vibration  COORDINATION:   finger-nose-finger, fine finger movements normal  REFLEXES:   deep tendon reflexes present and symmetric  GAIT/STATION:   narrow based gait     DIAGNOSTIC DATA (LABS, IMAGING, TESTING) - I reviewed patient records, labs, notes, testing and imaging myself where available.  Lab Results  Component Value Date   WBC 12.7 (H) 11/02/2012   HGB 14.6 11/04/2012   HCT 43.0 11/04/2012   MCV 86.7 11/02/2012   PLT 329 11/02/2012      Component Value Date/Time   NA 140 11/04/2012 0518   K 3.6 11/04/2012 0518   CL 110 11/04/2012 0518   CO2 20 11/02/2012 2320   GLUCOSE 113  (H) 11/04/2012 0518   BUN 22 11/04/2012 0518   CREATININE 0.90 11/04/2012 0518   CALCIUM 9.5 11/02/2012 2320   PROT 7.9 11/02/2012 2320   ALBUMIN 4.1 11/02/2012 2320   AST 29 11/02/2012 2320   ALT 34 11/02/2012 2320   ALKPHOS 56 11/02/2012 2320   BILITOT 0.4 11/02/2012 2320   GFRNONAA 86 (L) 11/02/2012 2320   GFRAA >90 11/02/2012 2320   No results found for: CHOL, HDL, LDLCALC, LDLDIRECT, TRIG, CHOLHDL No results found for: HGBA1C No results found for: VITAMINB12 No results found for: TSH   08/31/14 CT head / maxillofacial  - No evidence of acute intracranial abnormality. - Mild facial soft tissue swelling without acute bony abnormality. - Mild chronic small-vessel white matter ischemic changes.  Nov 2019: CBC, ESR, CRP - normal per patient   ASSESSMENT AND PLAN  78 y.o. year old female here with new onset double vision on the right side since August 2019.   Dx:  1. Diplopia      PLAN:  DOUBLE VISION (RIGHT GAZE; right CN6 palsy; microvascular infarct to nerve vs inflamm, autoimmune vs myasthenia gravis) - MRI brain and orbits - lab testing  Orders Placed This Encounter  Procedures  . MR BRAIN W WO CONTRAST  . MR ORBITS W WO CONTRAST  . AChR Abs with Reflex to MuSK  . Hemoglobin A1c  . Vitamin B12  . Lipid Panel  . TSH  . T4, Free   Return for pending test results.    Penni Bombard, MD 6/94/5038, 8:82 PM Certified in Neurology, Neurophysiology and Neuroimaging  [facility] [address], [place], [place] [address] [phone]

## 2018-03-16 ENCOUNTER — Telehealth: Payer: Self-pay | Admitting: Diagnostic Neuroimaging

## 2018-03-16 NOTE — Telephone Encounter (Signed)
[employer] medicare order sent to GI. No auth they will reach out to the pt to schedule.  °

## 2018-03-18 ENCOUNTER — Encounter: Payer: Self-pay | Admitting: Diagnostic Neuroimaging

## 2018-03-27 LAB — LIPID PANEL
CHOL/HDL RATIO: 3.3 ratio (ref 0.0–4.4)
CHOLESTEROL TOTAL: 186 mg/dL (ref 100–199)
HDL: 57 mg/dL (ref >39)
LDL Calculated: 108 mg/dL — ABNORMAL HIGH (ref 0–99)
TRIGLYCERIDES: 106 mg/dL (ref 0–149)
VLDL Cholesterol Cal: 21 mg/dL (ref 5–40)

## 2018-03-27 LAB — T4, FREE: Free T4: 1.07 ng/dL (ref 0.82–1.77)

## 2018-03-27 LAB — TSH: TSH: 2.93 u[IU]/mL (ref 0.450–4.500)

## 2018-03-27 LAB — HEMOGLOBIN A1C
ESTIMATED AVERAGE GLUCOSE: 117 mg/dL
HEMOGLOBIN A1C: 5.7 % — AB (ref 4.8–5.6)

## 2018-03-27 LAB — ACHR ABS WITH REFLEX TO MUSK: AChR Binding Ab, Serum: <0.03 nmol/L (ref 0.00–0.24)

## 2018-03-27 LAB — VITAMIN B12: Vitamin B-12: 321 pg/mL (ref 232–1245)

## 2018-03-27 LAB — MUSK ANTIBODIES: MuSK Antibodies: <1 U/mL

## 2018-03-28 ENCOUNTER — Telehealth: Payer: Self-pay | Admitting: *Deleted

## 2018-03-28 NOTE — Telephone Encounter (Signed)
Spoke with patient and informed her that lab results are unremarkable. Her MRIs are on Wed. I advised she'll get a call when those results are available. She verbalized understanding, appreciation.

## 2018-03-30 ENCOUNTER — Ambulatory Visit
Admission: RE | Admit: 2018-03-30 | Discharge: 2018-03-30 | Disposition: A | Discharge status: Home / Self Care | Payer: Medicare Other | Source: Ambulatory Visit | Attending: Diagnostic Neuroimaging | Admitting: Diagnostic Neuroimaging

## 2018-03-30 DIAGNOSIS — H532 Diplopia: Secondary | ICD-10-CM | POA: Diagnosis not present

## 2018-03-30 MED ORDER — GADOBENATE DIMEGLUMINE 529 MG/ML IV SOLN
15.0000 mL | Freq: Once | INTRAVENOUS | Status: AC | PRN
  Administered 2018-03-30: 15 mL via INTRAVENOUS

## 2018-04-04 ENCOUNTER — Telehealth: Payer: Self-pay | Admitting: *Deleted

## 2018-04-04 NOTE — Telephone Encounter (Signed)
Monitor symptoms. Follow up with me as needed. -VRP

## 2018-04-04 NOTE — Telephone Encounter (Signed)
Spoke to pt and relayed that per Dr. Leta Baptist to monitor and we will see her as needed.  She appreciated call back.

## 2018-04-04 NOTE — Telephone Encounter (Signed)
-----   Message from Penni Bombard, MD sent at 04/01/2018  3:33 PM EST ----- Unremarkable imaging results. Please call patient. Continue current plan. -VRP

## 2018-04-04 NOTE — Telephone Encounter (Signed)
I spoke to pt and relayed that her MRI brain and orbits unremarkable.  She continues with double vision, no change since last seen 03-15-18.  Has appt with Dr. Katy Fitch will be in mid to late March.  Did you need to see her she has not f/u. Her labs results were unremarkable as well.

## 2018-12-13 ENCOUNTER — Other Ambulatory Visit: Payer: Self-pay | Admitting: Internal Medicine

## 2018-12-13 DIAGNOSIS — M8580 Other specified disorders of bone density and structure, unspecified site: Secondary | ICD-10-CM

## 2019-01-06 ENCOUNTER — Other Ambulatory Visit: Payer: Self-pay

## 2019-01-06 ENCOUNTER — Ambulatory Visit
Admission: RE | Admit: 2019-01-06 | Discharge: 2019-01-06 | Disposition: A | Discharge status: Home / Self Care | Payer: Medicare Other | Source: Ambulatory Visit | Attending: Internal Medicine | Admitting: Internal Medicine

## 2019-01-06 DIAGNOSIS — M8580 Other specified disorders of bone density and structure, unspecified site: Secondary | ICD-10-CM

## 2019-10-31 ENCOUNTER — Other Ambulatory Visit: Payer: Self-pay | Admitting: Ophthalmology

## 2019-10-31 DIAGNOSIS — H53462 Homonymous bilateral field defects, left side: Secondary | ICD-10-CM

## 2020-02-08 DIAGNOSIS — H8111 Benign paroxysmal vertigo, right ear: Secondary | ICD-10-CM | POA: Diagnosis not present

## 2020-02-08 DIAGNOSIS — H9113 Presbycusis, bilateral: Secondary | ICD-10-CM | POA: Diagnosis not present

## 2020-03-07 DIAGNOSIS — H8111 Benign paroxysmal vertigo, right ear: Secondary | ICD-10-CM | POA: Diagnosis not present

## 2020-03-07 DIAGNOSIS — H8112 Benign paroxysmal vertigo, left ear: Secondary | ICD-10-CM | POA: Diagnosis not present

## 2020-03-14 DIAGNOSIS — H8112 Benign paroxysmal vertigo, left ear: Secondary | ICD-10-CM | POA: Diagnosis not present

## 2020-03-14 DIAGNOSIS — H8111 Benign paroxysmal vertigo, right ear: Secondary | ICD-10-CM | POA: Diagnosis not present

## 2020-04-29 DIAGNOSIS — H6121 Impacted cerumen, right ear: Secondary | ICD-10-CM | POA: Diagnosis not present

## 2020-06-17 DIAGNOSIS — H8110 Benign paroxysmal vertigo, unspecified ear: Secondary | ICD-10-CM | POA: Diagnosis not present

## 2020-06-17 DIAGNOSIS — G3184 Mild cognitive impairment, so stated: Secondary | ICD-10-CM | POA: Diagnosis not present

## 2020-06-17 DIAGNOSIS — R519 Headache, unspecified: Secondary | ICD-10-CM | POA: Diagnosis not present

## 2020-09-04 DIAGNOSIS — H8111 Benign paroxysmal vertigo, right ear: Secondary | ICD-10-CM | POA: Diagnosis not present

## 2020-09-04 DIAGNOSIS — H8112 Benign paroxysmal vertigo, left ear: Secondary | ICD-10-CM | POA: Diagnosis not present

## 2020-09-26 DIAGNOSIS — H8112 Benign paroxysmal vertigo, left ear: Secondary | ICD-10-CM | POA: Diagnosis not present

## 2020-09-26 DIAGNOSIS — H8111 Benign paroxysmal vertigo, right ear: Secondary | ICD-10-CM | POA: Diagnosis not present

## 2020-11-19 DIAGNOSIS — E0500 Thyrotoxicosis with diffuse goiter without thyrotoxic crisis or storm: Secondary | ICD-10-CM | POA: Diagnosis not present

## 2020-11-19 DIAGNOSIS — Z23 Encounter for immunization: Secondary | ICD-10-CM | POA: Diagnosis not present

## 2020-11-19 DIAGNOSIS — E890 Postprocedural hypothyroidism: Secondary | ICD-10-CM | POA: Diagnosis not present

## 2020-11-29 DIAGNOSIS — H6123 Impacted cerumen, bilateral: Secondary | ICD-10-CM | POA: Diagnosis not present

## 2020-12-20 DIAGNOSIS — Z1389 Encounter for screening for other disorder: Secondary | ICD-10-CM | POA: Diagnosis not present

## 2020-12-20 DIAGNOSIS — Z0000 Encounter for general adult medical examination without abnormal findings: Secondary | ICD-10-CM | POA: Diagnosis not present

## 2020-12-20 DIAGNOSIS — M545 Low back pain, unspecified: Secondary | ICD-10-CM | POA: Diagnosis not present

## 2020-12-20 DIAGNOSIS — G3184 Mild cognitive impairment, so stated: Secondary | ICD-10-CM | POA: Diagnosis not present

## 2020-12-20 DIAGNOSIS — E890 Postprocedural hypothyroidism: Secondary | ICD-10-CM | POA: Diagnosis not present

## 2020-12-24 DIAGNOSIS — R2689 Other abnormalities of gait and mobility: Secondary | ICD-10-CM | POA: Diagnosis not present

## 2020-12-24 DIAGNOSIS — M6281 Muscle weakness (generalized): Secondary | ICD-10-CM | POA: Diagnosis not present

## 2020-12-24 DIAGNOSIS — M5459 Other low back pain: Secondary | ICD-10-CM | POA: Diagnosis not present

## 2020-12-31 DIAGNOSIS — M5459 Other low back pain: Secondary | ICD-10-CM | POA: Diagnosis not present

## 2020-12-31 DIAGNOSIS — R2689 Other abnormalities of gait and mobility: Secondary | ICD-10-CM | POA: Diagnosis not present

## 2020-12-31 DIAGNOSIS — M6281 Muscle weakness (generalized): Secondary | ICD-10-CM | POA: Diagnosis not present

## 2021-01-03 DIAGNOSIS — Z7189 Other specified counseling: Secondary | ICD-10-CM | POA: Diagnosis not present

## 2021-01-07 DIAGNOSIS — M6281 Muscle weakness (generalized): Secondary | ICD-10-CM | POA: Diagnosis not present

## 2021-01-07 DIAGNOSIS — R2689 Other abnormalities of gait and mobility: Secondary | ICD-10-CM | POA: Diagnosis not present

## 2021-01-07 DIAGNOSIS — M5459 Other low back pain: Secondary | ICD-10-CM | POA: Diagnosis not present

## 2021-01-14 DIAGNOSIS — M6281 Muscle weakness (generalized): Secondary | ICD-10-CM | POA: Diagnosis not present

## 2021-01-14 DIAGNOSIS — M5459 Other low back pain: Secondary | ICD-10-CM | POA: Diagnosis not present

## 2021-01-14 DIAGNOSIS — R2689 Other abnormalities of gait and mobility: Secondary | ICD-10-CM | POA: Diagnosis not present

## 2021-01-21 DIAGNOSIS — R2689 Other abnormalities of gait and mobility: Secondary | ICD-10-CM | POA: Diagnosis not present

## 2021-01-21 DIAGNOSIS — M6281 Muscle weakness (generalized): Secondary | ICD-10-CM | POA: Diagnosis not present

## 2021-01-21 DIAGNOSIS — M5459 Other low back pain: Secondary | ICD-10-CM | POA: Diagnosis not present

## 2021-02-14 DIAGNOSIS — R42 Dizziness and giddiness: Secondary | ICD-10-CM | POA: Diagnosis not present

## 2021-06-07 DIAGNOSIS — M25561 Pain in right knee: Secondary | ICD-10-CM | POA: Diagnosis not present

## 2021-07-28 DIAGNOSIS — H6123 Impacted cerumen, bilateral: Secondary | ICD-10-CM | POA: Diagnosis not present

## 2021-10-22 DIAGNOSIS — H04123 Dry eye syndrome of bilateral lacrimal glands: Secondary | ICD-10-CM | POA: Diagnosis not present

## 2021-10-22 DIAGNOSIS — H532 Diplopia: Secondary | ICD-10-CM | POA: Diagnosis not present

## 2021-10-22 DIAGNOSIS — E0500 Thyrotoxicosis with diffuse goiter without thyrotoxic crisis or storm: Secondary | ICD-10-CM | POA: Diagnosis not present

## 2021-10-22 DIAGNOSIS — H5000 Unspecified esotropia: Secondary | ICD-10-CM | POA: Diagnosis not present

## 2021-10-22 DIAGNOSIS — H1045 Other chronic allergic conjunctivitis: Secondary | ICD-10-CM | POA: Diagnosis not present

## 2021-10-22 DIAGNOSIS — Z961 Presence of intraocular lens: Secondary | ICD-10-CM | POA: Diagnosis not present

## 2021-10-22 DIAGNOSIS — H4921 Sixth [abducent] nerve palsy, right eye: Secondary | ICD-10-CM | POA: Diagnosis not present

## 2021-11-25 DIAGNOSIS — R030 Elevated blood-pressure reading, without diagnosis of hypertension: Secondary | ICD-10-CM | POA: Diagnosis not present

## 2021-11-25 DIAGNOSIS — R21 Rash and other nonspecific skin eruption: Secondary | ICD-10-CM | POA: Diagnosis not present

## 2021-12-05 DIAGNOSIS — L299 Pruritus, unspecified: Secondary | ICD-10-CM | POA: Diagnosis not present

## 2021-12-05 DIAGNOSIS — R21 Rash and other nonspecific skin eruption: Secondary | ICD-10-CM | POA: Diagnosis not present

## 2022-01-01 DIAGNOSIS — D485 Neoplasm of uncertain behavior of skin: Secondary | ICD-10-CM | POA: Diagnosis not present

## 2022-01-01 DIAGNOSIS — L308 Other specified dermatitis: Secondary | ICD-10-CM | POA: Diagnosis not present

## 2022-01-01 DIAGNOSIS — L729 Follicular cyst of the skin and subcutaneous tissue, unspecified: Secondary | ICD-10-CM | POA: Diagnosis not present

## 2022-01-04 DIAGNOSIS — I6521 Occlusion and stenosis of right carotid artery: Secondary | ICD-10-CM | POA: Diagnosis not present

## 2022-01-04 DIAGNOSIS — I499 Cardiac arrhythmia, unspecified: Secondary | ICD-10-CM | POA: Diagnosis not present

## 2022-01-04 DIAGNOSIS — R231 Pallor: Secondary | ICD-10-CM | POA: Diagnosis not present

## 2022-01-04 DIAGNOSIS — I4891 Unspecified atrial fibrillation: Secondary | ICD-10-CM | POA: Diagnosis not present

## 2022-01-04 DIAGNOSIS — Z79899 Other long term (current) drug therapy: Secondary | ICD-10-CM | POA: Diagnosis not present

## 2022-01-04 DIAGNOSIS — R479 Unspecified speech disturbances: Secondary | ICD-10-CM | POA: Diagnosis not present

## 2022-01-04 DIAGNOSIS — Z7901 Long term (current) use of anticoagulants: Secondary | ICD-10-CM | POA: Diagnosis not present

## 2022-01-04 DIAGNOSIS — I619 Nontraumatic intracerebral hemorrhage, unspecified: Secondary | ICD-10-CM | POA: Diagnosis not present

## 2022-01-04 DIAGNOSIS — I340 Nonrheumatic mitral (valve) insufficiency: Secondary | ICD-10-CM | POA: Diagnosis not present

## 2022-01-04 DIAGNOSIS — R42 Dizziness and giddiness: Secondary | ICD-10-CM | POA: Diagnosis not present

## 2022-01-04 DIAGNOSIS — I081 Rheumatic disorders of both mitral and tricuspid valves: Secondary | ICD-10-CM | POA: Diagnosis not present

## 2022-01-04 DIAGNOSIS — R29702 NIHSS score 2: Secondary | ICD-10-CM | POA: Diagnosis not present

## 2022-01-04 DIAGNOSIS — R110 Nausea: Secondary | ICD-10-CM | POA: Diagnosis not present

## 2022-01-04 DIAGNOSIS — R29810 Facial weakness: Secondary | ICD-10-CM | POA: Diagnosis not present

## 2022-01-04 DIAGNOSIS — Z20822 Contact with and (suspected) exposure to covid-19: Secondary | ICD-10-CM | POA: Diagnosis not present

## 2022-01-04 DIAGNOSIS — R0689 Other abnormalities of breathing: Secondary | ICD-10-CM | POA: Diagnosis not present

## 2022-01-04 DIAGNOSIS — R29700 NIHSS score 0: Secondary | ICD-10-CM | POA: Diagnosis not present

## 2022-01-04 DIAGNOSIS — R471 Dysarthria and anarthria: Secondary | ICD-10-CM | POA: Diagnosis not present

## 2022-01-04 DIAGNOSIS — I639 Cerebral infarction, unspecified: Secondary | ICD-10-CM | POA: Diagnosis not present

## 2022-01-04 DIAGNOSIS — E039 Hypothyroidism, unspecified: Secondary | ICD-10-CM | POA: Diagnosis not present

## 2022-01-04 DIAGNOSIS — I272 Pulmonary hypertension, unspecified: Secondary | ICD-10-CM | POA: Diagnosis not present

## 2022-01-04 DIAGNOSIS — R55 Syncope and collapse: Secondary | ICD-10-CM | POA: Diagnosis not present

## 2022-01-04 DIAGNOSIS — Z87891 Personal history of nicotine dependence: Secondary | ICD-10-CM | POA: Diagnosis not present

## 2022-01-04 DIAGNOSIS — R4701 Aphasia: Secondary | ICD-10-CM | POA: Diagnosis not present

## 2022-01-04 DIAGNOSIS — I6782 Cerebral ischemia: Secondary | ICD-10-CM | POA: Diagnosis not present

## 2022-01-04 DIAGNOSIS — R000 Tachycardia, unspecified: Secondary | ICD-10-CM | POA: Diagnosis not present

## 2022-01-04 DIAGNOSIS — I513 Intracardiac thrombosis, not elsewhere classified: Secondary | ICD-10-CM | POA: Diagnosis not present

## 2022-01-08 DIAGNOSIS — M6281 Muscle weakness (generalized): Secondary | ICD-10-CM | POA: Diagnosis not present

## 2022-01-08 DIAGNOSIS — G459 Transient cerebral ischemic attack, unspecified: Secondary | ICD-10-CM | POA: Diagnosis not present

## 2022-01-08 DIAGNOSIS — R2689 Other abnormalities of gait and mobility: Secondary | ICD-10-CM | POA: Diagnosis not present

## 2022-01-08 DIAGNOSIS — I4891 Unspecified atrial fibrillation: Secondary | ICD-10-CM | POA: Diagnosis not present

## 2022-01-08 DIAGNOSIS — I639 Cerebral infarction, unspecified: Secondary | ICD-10-CM | POA: Diagnosis not present

## 2022-01-08 DIAGNOSIS — R2681 Unsteadiness on feet: Secondary | ICD-10-CM | POA: Diagnosis not present

## 2022-01-08 DIAGNOSIS — I513 Intracardiac thrombosis, not elsewhere classified: Secondary | ICD-10-CM | POA: Diagnosis not present

## 2022-01-09 DIAGNOSIS — I4891 Unspecified atrial fibrillation: Secondary | ICD-10-CM | POA: Diagnosis not present

## 2022-01-09 DIAGNOSIS — M6281 Muscle weakness (generalized): Secondary | ICD-10-CM | POA: Diagnosis not present

## 2022-01-09 DIAGNOSIS — R2681 Unsteadiness on feet: Secondary | ICD-10-CM | POA: Diagnosis not present

## 2022-01-09 DIAGNOSIS — E039 Hypothyroidism, unspecified: Secondary | ICD-10-CM | POA: Diagnosis not present

## 2022-01-09 DIAGNOSIS — I480 Paroxysmal atrial fibrillation: Secondary | ICD-10-CM | POA: Diagnosis not present

## 2022-01-09 DIAGNOSIS — I513 Intracardiac thrombosis, not elsewhere classified: Secondary | ICD-10-CM | POA: Diagnosis not present

## 2022-01-09 DIAGNOSIS — R2689 Other abnormalities of gait and mobility: Secondary | ICD-10-CM | POA: Diagnosis not present

## 2022-01-09 DIAGNOSIS — I639 Cerebral infarction, unspecified: Secondary | ICD-10-CM | POA: Diagnosis not present

## 2022-01-09 DIAGNOSIS — G459 Transient cerebral ischemic attack, unspecified: Secondary | ICD-10-CM | POA: Diagnosis not present

## 2022-01-11 DIAGNOSIS — G459 Transient cerebral ischemic attack, unspecified: Secondary | ICD-10-CM | POA: Diagnosis not present

## 2022-01-11 DIAGNOSIS — R2681 Unsteadiness on feet: Secondary | ICD-10-CM | POA: Diagnosis not present

## 2022-01-11 DIAGNOSIS — R2689 Other abnormalities of gait and mobility: Secondary | ICD-10-CM | POA: Diagnosis not present

## 2022-01-11 DIAGNOSIS — I4891 Unspecified atrial fibrillation: Secondary | ICD-10-CM | POA: Diagnosis not present

## 2022-01-11 DIAGNOSIS — I639 Cerebral infarction, unspecified: Secondary | ICD-10-CM | POA: Diagnosis not present

## 2022-01-11 DIAGNOSIS — I513 Intracardiac thrombosis, not elsewhere classified: Secondary | ICD-10-CM | POA: Diagnosis not present

## 2022-01-11 DIAGNOSIS — M6281 Muscle weakness (generalized): Secondary | ICD-10-CM | POA: Diagnosis not present

## 2022-01-12 DIAGNOSIS — I4891 Unspecified atrial fibrillation: Secondary | ICD-10-CM | POA: Diagnosis not present

## 2022-01-12 DIAGNOSIS — R2689 Other abnormalities of gait and mobility: Secondary | ICD-10-CM | POA: Diagnosis not present

## 2022-01-12 DIAGNOSIS — I513 Intracardiac thrombosis, not elsewhere classified: Secondary | ICD-10-CM | POA: Diagnosis not present

## 2022-01-12 DIAGNOSIS — G459 Transient cerebral ischemic attack, unspecified: Secondary | ICD-10-CM | POA: Diagnosis not present

## 2022-01-12 DIAGNOSIS — I639 Cerebral infarction, unspecified: Secondary | ICD-10-CM | POA: Diagnosis not present

## 2022-01-12 DIAGNOSIS — R2681 Unsteadiness on feet: Secondary | ICD-10-CM | POA: Diagnosis not present

## 2022-01-12 DIAGNOSIS — M6281 Muscle weakness (generalized): Secondary | ICD-10-CM | POA: Diagnosis not present

## 2022-01-13 DIAGNOSIS — R471 Dysarthria and anarthria: Secondary | ICD-10-CM | POA: Diagnosis not present

## 2022-01-13 DIAGNOSIS — H8111 Benign paroxysmal vertigo, right ear: Secondary | ICD-10-CM | POA: Diagnosis not present

## 2022-01-13 DIAGNOSIS — R42 Dizziness and giddiness: Secondary | ICD-10-CM | POA: Diagnosis not present

## 2022-01-13 DIAGNOSIS — I4891 Unspecified atrial fibrillation: Secondary | ICD-10-CM | POA: Diagnosis not present

## 2022-01-15 DIAGNOSIS — E538 Deficiency of other specified B group vitamins: Secondary | ICD-10-CM | POA: Diagnosis not present

## 2022-01-15 DIAGNOSIS — R262 Difficulty in walking, not elsewhere classified: Secondary | ICD-10-CM | POA: Diagnosis not present

## 2022-01-15 DIAGNOSIS — R260 Ataxic gait: Secondary | ICD-10-CM | POA: Diagnosis not present

## 2022-01-15 DIAGNOSIS — Z0000 Encounter for general adult medical examination without abnormal findings: Secondary | ICD-10-CM | POA: Diagnosis not present

## 2022-01-15 DIAGNOSIS — D6869 Other thrombophilia: Secondary | ICD-10-CM | POA: Diagnosis not present

## 2022-01-15 DIAGNOSIS — M8589 Other specified disorders of bone density and structure, multiple sites: Secondary | ICD-10-CM | POA: Diagnosis not present

## 2022-01-15 DIAGNOSIS — R2689 Other abnormalities of gait and mobility: Secondary | ICD-10-CM | POA: Diagnosis not present

## 2022-01-15 DIAGNOSIS — G3184 Mild cognitive impairment, so stated: Secondary | ICD-10-CM | POA: Diagnosis not present

## 2022-01-15 DIAGNOSIS — Z8673 Personal history of transient ischemic attack (TIA), and cerebral infarction without residual deficits: Secondary | ICD-10-CM | POA: Diagnosis not present

## 2022-01-15 DIAGNOSIS — I639 Cerebral infarction, unspecified: Secondary | ICD-10-CM | POA: Diagnosis not present

## 2022-01-15 DIAGNOSIS — M6281 Muscle weakness (generalized): Secondary | ICD-10-CM | POA: Diagnosis not present

## 2022-01-15 DIAGNOSIS — E890 Postprocedural hypothyroidism: Secondary | ICD-10-CM | POA: Diagnosis not present

## 2022-01-15 DIAGNOSIS — G459 Transient cerebral ischemic attack, unspecified: Secondary | ICD-10-CM | POA: Diagnosis not present

## 2022-01-15 DIAGNOSIS — E7800 Pure hypercholesterolemia, unspecified: Secondary | ICD-10-CM | POA: Diagnosis not present

## 2022-01-15 DIAGNOSIS — R4701 Aphasia: Secondary | ICD-10-CM | POA: Diagnosis not present

## 2022-01-15 DIAGNOSIS — I513 Intracardiac thrombosis, not elsewhere classified: Secondary | ICD-10-CM | POA: Diagnosis not present

## 2022-01-15 DIAGNOSIS — I4819 Other persistent atrial fibrillation: Secondary | ICD-10-CM | POA: Diagnosis not present

## 2022-01-19 DIAGNOSIS — G459 Transient cerebral ischemic attack, unspecified: Secondary | ICD-10-CM | POA: Diagnosis not present

## 2022-01-19 DIAGNOSIS — I513 Intracardiac thrombosis, not elsewhere classified: Secondary | ICD-10-CM | POA: Diagnosis not present

## 2022-01-19 DIAGNOSIS — R262 Difficulty in walking, not elsewhere classified: Secondary | ICD-10-CM | POA: Diagnosis not present

## 2022-01-19 DIAGNOSIS — I639 Cerebral infarction, unspecified: Secondary | ICD-10-CM | POA: Diagnosis not present

## 2022-01-19 DIAGNOSIS — M6281 Muscle weakness (generalized): Secondary | ICD-10-CM | POA: Diagnosis not present

## 2022-01-19 DIAGNOSIS — R260 Ataxic gait: Secondary | ICD-10-CM | POA: Diagnosis not present

## 2022-01-22 DIAGNOSIS — M6281 Muscle weakness (generalized): Secondary | ICD-10-CM | POA: Diagnosis not present

## 2022-01-22 DIAGNOSIS — R260 Ataxic gait: Secondary | ICD-10-CM | POA: Diagnosis not present

## 2022-01-22 DIAGNOSIS — R262 Difficulty in walking, not elsewhere classified: Secondary | ICD-10-CM | POA: Diagnosis not present

## 2022-01-22 DIAGNOSIS — I639 Cerebral infarction, unspecified: Secondary | ICD-10-CM | POA: Diagnosis not present

## 2022-01-22 DIAGNOSIS — I513 Intracardiac thrombosis, not elsewhere classified: Secondary | ICD-10-CM | POA: Diagnosis not present

## 2022-01-22 DIAGNOSIS — I6389 Other cerebral infarction: Secondary | ICD-10-CM | POA: Diagnosis not present

## 2022-01-22 DIAGNOSIS — G459 Transient cerebral ischemic attack, unspecified: Secondary | ICD-10-CM | POA: Diagnosis not present

## 2022-01-23 DIAGNOSIS — R260 Ataxic gait: Secondary | ICD-10-CM | POA: Diagnosis not present

## 2022-01-23 DIAGNOSIS — I513 Intracardiac thrombosis, not elsewhere classified: Secondary | ICD-10-CM | POA: Diagnosis not present

## 2022-01-23 DIAGNOSIS — R262 Difficulty in walking, not elsewhere classified: Secondary | ICD-10-CM | POA: Diagnosis not present

## 2022-01-23 DIAGNOSIS — G459 Transient cerebral ischemic attack, unspecified: Secondary | ICD-10-CM | POA: Diagnosis not present

## 2022-01-23 DIAGNOSIS — M6281 Muscle weakness (generalized): Secondary | ICD-10-CM | POA: Diagnosis not present

## 2022-01-23 DIAGNOSIS — I639 Cerebral infarction, unspecified: Secondary | ICD-10-CM | POA: Diagnosis not present

## 2022-01-27 DIAGNOSIS — R260 Ataxic gait: Secondary | ICD-10-CM | POA: Diagnosis not present

## 2022-01-27 DIAGNOSIS — I639 Cerebral infarction, unspecified: Secondary | ICD-10-CM | POA: Diagnosis not present

## 2022-01-27 DIAGNOSIS — I513 Intracardiac thrombosis, not elsewhere classified: Secondary | ICD-10-CM | POA: Diagnosis not present

## 2022-01-27 DIAGNOSIS — M6281 Muscle weakness (generalized): Secondary | ICD-10-CM | POA: Diagnosis not present

## 2022-01-27 DIAGNOSIS — G459 Transient cerebral ischemic attack, unspecified: Secondary | ICD-10-CM | POA: Diagnosis not present

## 2022-01-27 DIAGNOSIS — R262 Difficulty in walking, not elsewhere classified: Secondary | ICD-10-CM | POA: Diagnosis not present

## 2022-01-28 DIAGNOSIS — M6281 Muscle weakness (generalized): Secondary | ICD-10-CM | POA: Diagnosis not present

## 2022-01-28 DIAGNOSIS — I513 Intracardiac thrombosis, not elsewhere classified: Secondary | ICD-10-CM | POA: Diagnosis not present

## 2022-01-28 DIAGNOSIS — R262 Difficulty in walking, not elsewhere classified: Secondary | ICD-10-CM | POA: Diagnosis not present

## 2022-01-28 DIAGNOSIS — R260 Ataxic gait: Secondary | ICD-10-CM | POA: Diagnosis not present

## 2022-01-28 DIAGNOSIS — G459 Transient cerebral ischemic attack, unspecified: Secondary | ICD-10-CM | POA: Diagnosis not present

## 2022-01-28 DIAGNOSIS — I639 Cerebral infarction, unspecified: Secondary | ICD-10-CM | POA: Diagnosis not present

## 2022-02-23 ENCOUNTER — Other Ambulatory Visit: Payer: Self-pay | Admitting: Internal Medicine

## 2022-02-23 DIAGNOSIS — M8589 Other specified disorders of bone density and structure, multiple sites: Secondary | ICD-10-CM

## 2022-03-06 DIAGNOSIS — G459 Transient cerebral ischemic attack, unspecified: Secondary | ICD-10-CM | POA: Diagnosis not present

## 2022-03-06 DIAGNOSIS — I513 Intracardiac thrombosis, not elsewhere classified: Secondary | ICD-10-CM | POA: Diagnosis not present

## 2022-03-06 DIAGNOSIS — I639 Cerebral infarction, unspecified: Secondary | ICD-10-CM | POA: Diagnosis not present

## 2022-03-24 DIAGNOSIS — R946 Abnormal results of thyroid function studies: Secondary | ICD-10-CM | POA: Diagnosis not present

## 2022-03-24 DIAGNOSIS — G3184 Mild cognitive impairment, so stated: Secondary | ICD-10-CM | POA: Diagnosis not present

## 2022-04-13 DIAGNOSIS — E559 Vitamin D deficiency, unspecified: Secondary | ICD-10-CM | POA: Diagnosis not present

## 2022-04-13 DIAGNOSIS — E890 Postprocedural hypothyroidism: Secondary | ICD-10-CM | POA: Diagnosis not present

## 2022-04-13 DIAGNOSIS — E0500 Thyrotoxicosis with diffuse goiter without thyrotoxic crisis or storm: Secondary | ICD-10-CM | POA: Diagnosis not present

## 2022-04-13 DIAGNOSIS — I6930 Unspecified sequelae of cerebral infarction: Secondary | ICD-10-CM | POA: Diagnosis not present

## 2022-04-23 DIAGNOSIS — Z7982 Long term (current) use of aspirin: Secondary | ICD-10-CM | POA: Diagnosis not present

## 2022-04-23 DIAGNOSIS — Z8673 Personal history of transient ischemic attack (TIA), and cerebral infarction without residual deficits: Secondary | ICD-10-CM | POA: Diagnosis not present

## 2022-05-14 DIAGNOSIS — I639 Cerebral infarction, unspecified: Secondary | ICD-10-CM | POA: Diagnosis not present

## 2022-05-19 DIAGNOSIS — M79672 Pain in left foot: Secondary | ICD-10-CM | POA: Diagnosis not present

## 2022-05-19 DIAGNOSIS — W19XXXA Unspecified fall, initial encounter: Secondary | ICD-10-CM | POA: Diagnosis not present

## 2022-05-19 DIAGNOSIS — M25672 Stiffness of left ankle, not elsewhere classified: Secondary | ICD-10-CM | POA: Diagnosis not present

## 2022-05-20 DIAGNOSIS — M79672 Pain in left foot: Secondary | ICD-10-CM | POA: Diagnosis not present

## 2022-05-21 DIAGNOSIS — I639 Cerebral infarction, unspecified: Secondary | ICD-10-CM | POA: Diagnosis not present

## 2022-05-21 DIAGNOSIS — H6123 Impacted cerumen, bilateral: Secondary | ICD-10-CM | POA: Diagnosis not present

## 2022-05-25 DIAGNOSIS — I639 Cerebral infarction, unspecified: Secondary | ICD-10-CM | POA: Diagnosis not present

## 2022-05-25 DIAGNOSIS — S92309A Fracture of unspecified metatarsal bone(s), unspecified foot, initial encounter for closed fracture: Secondary | ICD-10-CM | POA: Diagnosis not present

## 2022-05-25 DIAGNOSIS — M79673 Pain in unspecified foot: Secondary | ICD-10-CM | POA: Diagnosis not present

## 2022-05-25 DIAGNOSIS — Z6824 Body mass index (BMI) 24.0-24.9, adult: Secondary | ICD-10-CM | POA: Diagnosis not present

## 2022-05-27 DIAGNOSIS — I639 Cerebral infarction, unspecified: Secondary | ICD-10-CM | POA: Diagnosis not present

## 2022-06-01 DIAGNOSIS — I639 Cerebral infarction, unspecified: Secondary | ICD-10-CM | POA: Diagnosis not present

## 2022-06-02 DIAGNOSIS — E0500 Thyrotoxicosis with diffuse goiter without thyrotoxic crisis or storm: Secondary | ICD-10-CM | POA: Diagnosis not present

## 2022-06-02 DIAGNOSIS — E559 Vitamin D deficiency, unspecified: Secondary | ICD-10-CM | POA: Diagnosis not present

## 2022-06-02 DIAGNOSIS — E7800 Pure hypercholesterolemia, unspecified: Secondary | ICD-10-CM | POA: Diagnosis not present

## 2022-06-02 DIAGNOSIS — I6930 Unspecified sequelae of cerebral infarction: Secondary | ICD-10-CM | POA: Diagnosis not present

## 2022-06-02 DIAGNOSIS — E890 Postprocedural hypothyroidism: Secondary | ICD-10-CM | POA: Diagnosis not present

## 2022-06-04 DIAGNOSIS — I639 Cerebral infarction, unspecified: Secondary | ICD-10-CM | POA: Diagnosis not present

## 2022-06-11 DIAGNOSIS — I639 Cerebral infarction, unspecified: Secondary | ICD-10-CM | POA: Diagnosis not present

## 2022-06-18 DIAGNOSIS — I639 Cerebral infarction, unspecified: Secondary | ICD-10-CM | POA: Diagnosis not present

## 2022-06-18 DIAGNOSIS — S92352A Displaced fracture of fifth metatarsal bone, left foot, initial encounter for closed fracture: Secondary | ICD-10-CM | POA: Diagnosis not present

## 2022-07-03 DIAGNOSIS — I639 Cerebral infarction, unspecified: Secondary | ICD-10-CM | POA: Diagnosis not present

## 2022-07-15 DIAGNOSIS — E890 Postprocedural hypothyroidism: Secondary | ICD-10-CM | POA: Diagnosis not present

## 2022-07-15 DIAGNOSIS — I6930 Unspecified sequelae of cerebral infarction: Secondary | ICD-10-CM | POA: Diagnosis not present

## 2022-07-15 DIAGNOSIS — H81399 Other peripheral vertigo, unspecified ear: Secondary | ICD-10-CM | POA: Diagnosis not present

## 2022-07-15 DIAGNOSIS — D6869 Other thrombophilia: Secondary | ICD-10-CM | POA: Diagnosis not present

## 2022-07-15 DIAGNOSIS — I4819 Other persistent atrial fibrillation: Secondary | ICD-10-CM | POA: Diagnosis not present

## 2022-07-15 DIAGNOSIS — E559 Vitamin D deficiency, unspecified: Secondary | ICD-10-CM | POA: Diagnosis not present

## 2022-07-16 DIAGNOSIS — I639 Cerebral infarction, unspecified: Secondary | ICD-10-CM | POA: Diagnosis not present

## 2022-07-28 DIAGNOSIS — K6289 Other specified diseases of anus and rectum: Secondary | ICD-10-CM | POA: Diagnosis not present

## 2022-07-28 DIAGNOSIS — K5909 Other constipation: Secondary | ICD-10-CM | POA: Diagnosis not present

## 2022-08-18 DIAGNOSIS — Z8673 Personal history of transient ischemic attack (TIA), and cerebral infarction without residual deficits: Secondary | ICD-10-CM | POA: Diagnosis not present

## 2022-08-18 DIAGNOSIS — Z7982 Long term (current) use of aspirin: Secondary | ICD-10-CM | POA: Diagnosis not present

## 2022-08-26 DIAGNOSIS — I4821 Permanent atrial fibrillation: Secondary | ICD-10-CM | POA: Diagnosis not present

## 2022-08-26 DIAGNOSIS — Z7901 Long term (current) use of anticoagulants: Secondary | ICD-10-CM | POA: Diagnosis not present

## 2022-08-26 DIAGNOSIS — Z8673 Personal history of transient ischemic attack (TIA), and cerebral infarction without residual deficits: Secondary | ICD-10-CM | POA: Diagnosis not present

## 2022-08-26 DIAGNOSIS — Z79899 Other long term (current) drug therapy: Secondary | ICD-10-CM | POA: Diagnosis not present

## 2022-09-07 DIAGNOSIS — M545 Low back pain, unspecified: Secondary | ICD-10-CM | POA: Diagnosis not present

## 2022-09-15 DIAGNOSIS — I6930 Unspecified sequelae of cerebral infarction: Secondary | ICD-10-CM | POA: Diagnosis not present

## 2022-09-15 DIAGNOSIS — E890 Postprocedural hypothyroidism: Secondary | ICD-10-CM | POA: Diagnosis not present

## 2023-02-08 DIAGNOSIS — M545 Low back pain, unspecified: Secondary | ICD-10-CM | POA: Diagnosis not present

## 2023-02-08 DIAGNOSIS — M533 Sacrococcygeal disorders, not elsewhere classified: Secondary | ICD-10-CM | POA: Diagnosis not present

## 2023-02-26 DIAGNOSIS — M545 Low back pain, unspecified: Secondary | ICD-10-CM | POA: Diagnosis not present

## 2023-03-02 DIAGNOSIS — M545 Low back pain, unspecified: Secondary | ICD-10-CM | POA: Diagnosis not present

## 2023-03-10 DIAGNOSIS — M545 Low back pain, unspecified: Secondary | ICD-10-CM | POA: Diagnosis not present

## 2023-03-12 DIAGNOSIS — M545 Low back pain, unspecified: Secondary | ICD-10-CM | POA: Diagnosis not present

## 2023-03-16 DIAGNOSIS — M545 Low back pain, unspecified: Secondary | ICD-10-CM | POA: Diagnosis not present

## 2023-03-18 DIAGNOSIS — M545 Low back pain, unspecified: Secondary | ICD-10-CM | POA: Diagnosis not present

## 2023-03-24 DIAGNOSIS — M545 Low back pain, unspecified: Secondary | ICD-10-CM | POA: Diagnosis not present

## 2023-03-30 DIAGNOSIS — M545 Low back pain, unspecified: Secondary | ICD-10-CM | POA: Diagnosis not present

## 2023-04-01 DIAGNOSIS — M545 Low back pain, unspecified: Secondary | ICD-10-CM | POA: Diagnosis not present

## 2023-04-05 DIAGNOSIS — M545 Low back pain, unspecified: Secondary | ICD-10-CM | POA: Diagnosis not present

## 2023-04-08 DIAGNOSIS — M545 Low back pain, unspecified: Secondary | ICD-10-CM | POA: Diagnosis not present

## 2023-04-13 DIAGNOSIS — M545 Low back pain, unspecified: Secondary | ICD-10-CM | POA: Diagnosis not present

## 2023-04-15 DIAGNOSIS — M545 Low back pain, unspecified: Secondary | ICD-10-CM | POA: Diagnosis not present

## 2023-04-16 ENCOUNTER — Other Ambulatory Visit (HOSPITAL_BASED_OUTPATIENT_CLINIC_OR_DEPARTMENT_OTHER): Payer: Self-pay | Admitting: Orthopaedic Surgery

## 2023-04-16 ENCOUNTER — Encounter (HOSPITAL_BASED_OUTPATIENT_CLINIC_OR_DEPARTMENT_OTHER): Payer: Self-pay | Admitting: Orthopaedic Surgery

## 2023-04-16 DIAGNOSIS — M545 Low back pain, unspecified: Secondary | ICD-10-CM | POA: Diagnosis not present

## 2023-04-20 DIAGNOSIS — M545 Low back pain, unspecified: Secondary | ICD-10-CM | POA: Diagnosis not present

## 2023-04-22 DIAGNOSIS — M545 Low back pain, unspecified: Secondary | ICD-10-CM | POA: Diagnosis not present

## 2023-04-25 ENCOUNTER — Ambulatory Visit (HOSPITAL_BASED_OUTPATIENT_CLINIC_OR_DEPARTMENT_OTHER)

## 2023-04-27 DIAGNOSIS — G301 Alzheimer's disease with late onset: Secondary | ICD-10-CM | POA: Diagnosis not present

## 2023-05-02 ENCOUNTER — Ambulatory Visit (HOSPITAL_BASED_OUTPATIENT_CLINIC_OR_DEPARTMENT_OTHER)
Admission: RE | Admit: 2023-05-02 | Discharge: 2023-05-02 | Disposition: A | Discharge status: Home / Self Care | Source: Ambulatory Visit | Attending: Orthopaedic Surgery | Admitting: Orthopaedic Surgery

## 2023-05-02 DIAGNOSIS — M5137 Other intervertebral disc degeneration, lumbosacral region with discogenic back pain only: Secondary | ICD-10-CM | POA: Diagnosis not present

## 2023-05-02 DIAGNOSIS — M545 Low back pain, unspecified: Secondary | ICD-10-CM | POA: Diagnosis not present

## 2023-05-02 DIAGNOSIS — M5136 Other intervertebral disc degeneration, lumbar region with discogenic back pain only: Secondary | ICD-10-CM | POA: Diagnosis not present

## 2023-05-02 DIAGNOSIS — R932 Abnormal findings on diagnostic imaging of liver and biliary tract: Secondary | ICD-10-CM | POA: Diagnosis not present

## 2023-05-02 DIAGNOSIS — R109 Unspecified abdominal pain: Secondary | ICD-10-CM | POA: Diagnosis not present

## 2023-05-17 DIAGNOSIS — M533 Sacrococcygeal disorders, not elsewhere classified: Secondary | ICD-10-CM | POA: Diagnosis not present

## 2023-05-25 DIAGNOSIS — K5909 Other constipation: Secondary | ICD-10-CM | POA: Diagnosis not present

## 2023-05-25 DIAGNOSIS — Z8673 Personal history of transient ischemic attack (TIA), and cerebral infarction without residual deficits: Secondary | ICD-10-CM | POA: Diagnosis not present

## 2023-05-26 DIAGNOSIS — M533 Sacrococcygeal disorders, not elsewhere classified: Secondary | ICD-10-CM | POA: Diagnosis not present

## 2023-06-14 DIAGNOSIS — H9193 Unspecified hearing loss, bilateral: Secondary | ICD-10-CM | POA: Diagnosis not present

## 2023-06-14 DIAGNOSIS — R251 Tremor, unspecified: Secondary | ICD-10-CM | POA: Diagnosis not present

## 2023-06-14 DIAGNOSIS — R413 Other amnesia: Secondary | ICD-10-CM | POA: Diagnosis not present

## 2023-06-18 DIAGNOSIS — R413 Other amnesia: Secondary | ICD-10-CM | POA: Diagnosis not present

## 2023-06-18 DIAGNOSIS — R251 Tremor, unspecified: Secondary | ICD-10-CM | POA: Diagnosis not present

## 2023-06-21 DIAGNOSIS — M533 Sacrococcygeal disorders, not elsewhere classified: Secondary | ICD-10-CM | POA: Diagnosis not present

## 2023-07-03 DIAGNOSIS — I4819 Other persistent atrial fibrillation: Secondary | ICD-10-CM | POA: Diagnosis not present

## 2023-07-03 DIAGNOSIS — E7800 Pure hypercholesterolemia, unspecified: Secondary | ICD-10-CM | POA: Diagnosis not present

## 2023-07-03 DIAGNOSIS — E890 Postprocedural hypothyroidism: Secondary | ICD-10-CM | POA: Diagnosis not present

## 2023-08-02 DIAGNOSIS — H9113 Presbycusis, bilateral: Secondary | ICD-10-CM | POA: Diagnosis not present

## 2023-08-02 DIAGNOSIS — H903 Sensorineural hearing loss, bilateral: Secondary | ICD-10-CM | POA: Diagnosis not present

## 2023-08-02 DIAGNOSIS — H6123 Impacted cerumen, bilateral: Secondary | ICD-10-CM | POA: Diagnosis not present

## 2023-08-11 DIAGNOSIS — L723 Sebaceous cyst: Secondary | ICD-10-CM | POA: Diagnosis not present

## 2023-08-18 DIAGNOSIS — R599 Enlarged lymph nodes, unspecified: Secondary | ICD-10-CM | POA: Diagnosis not present

## 2023-08-18 DIAGNOSIS — R591 Generalized enlarged lymph nodes: Secondary | ICD-10-CM | POA: Diagnosis not present

## 2023-08-23 DIAGNOSIS — M533 Sacrococcygeal disorders, not elsewhere classified: Secondary | ICD-10-CM | POA: Diagnosis not present

## 2023-09-02 DIAGNOSIS — M533 Sacrococcygeal disorders, not elsewhere classified: Secondary | ICD-10-CM | POA: Diagnosis not present

## 2023-09-02 DIAGNOSIS — E7800 Pure hypercholesterolemia, unspecified: Secondary | ICD-10-CM | POA: Diagnosis not present

## 2023-09-02 DIAGNOSIS — I4819 Other persistent atrial fibrillation: Secondary | ICD-10-CM | POA: Diagnosis not present

## 2023-09-02 DIAGNOSIS — E890 Postprocedural hypothyroidism: Secondary | ICD-10-CM | POA: Diagnosis not present

## 2023-09-22 DIAGNOSIS — E785 Hyperlipidemia, unspecified: Secondary | ICD-10-CM | POA: Diagnosis not present

## 2023-09-22 DIAGNOSIS — Z8673 Personal history of transient ischemic attack (TIA), and cerebral infarction without residual deficits: Secondary | ICD-10-CM | POA: Diagnosis not present

## 2023-09-22 DIAGNOSIS — I513 Intracardiac thrombosis, not elsewhere classified: Secondary | ICD-10-CM | POA: Diagnosis not present

## 2023-09-22 DIAGNOSIS — I4821 Permanent atrial fibrillation: Secondary | ICD-10-CM | POA: Diagnosis not present

## 2023-09-24 DIAGNOSIS — I4510 Unspecified right bundle-branch block: Secondary | ICD-10-CM | POA: Diagnosis not present

## 2023-09-24 DIAGNOSIS — I493 Ventricular premature depolarization: Secondary | ICD-10-CM | POA: Diagnosis not present

## 2023-09-24 DIAGNOSIS — I2510 Atherosclerotic heart disease of native coronary artery without angina pectoris: Secondary | ICD-10-CM | POA: Diagnosis not present

## 2023-09-24 DIAGNOSIS — I4891 Unspecified atrial fibrillation: Secondary | ICD-10-CM | POA: Diagnosis not present

## 2023-09-27 DIAGNOSIS — Z8673 Personal history of transient ischemic attack (TIA), and cerebral infarction without residual deficits: Secondary | ICD-10-CM | POA: Diagnosis not present

## 2023-09-27 DIAGNOSIS — R413 Other amnesia: Secondary | ICD-10-CM | POA: Diagnosis not present

## 2023-09-27 DIAGNOSIS — E441 Mild protein-calorie malnutrition: Secondary | ICD-10-CM | POA: Diagnosis not present

## 2023-09-27 DIAGNOSIS — K5909 Other constipation: Secondary | ICD-10-CM | POA: Diagnosis not present

## 2023-10-03 DIAGNOSIS — E890 Postprocedural hypothyroidism: Secondary | ICD-10-CM | POA: Diagnosis not present

## 2023-10-03 DIAGNOSIS — I4819 Other persistent atrial fibrillation: Secondary | ICD-10-CM | POA: Diagnosis not present

## 2023-10-03 DIAGNOSIS — E7800 Pure hypercholesterolemia, unspecified: Secondary | ICD-10-CM | POA: Diagnosis not present

## 2023-10-20 DIAGNOSIS — R300 Dysuria: Secondary | ICD-10-CM | POA: Diagnosis not present

## 2023-10-20 DIAGNOSIS — N390 Urinary tract infection, site not specified: Secondary | ICD-10-CM | POA: Diagnosis not present

## 2023-10-25 DIAGNOSIS — R251 Tremor, unspecified: Secondary | ICD-10-CM | POA: Diagnosis not present

## 2023-11-02 DIAGNOSIS — E890 Postprocedural hypothyroidism: Secondary | ICD-10-CM | POA: Diagnosis not present

## 2023-11-02 DIAGNOSIS — E7800 Pure hypercholesterolemia, unspecified: Secondary | ICD-10-CM | POA: Diagnosis not present

## 2023-11-02 DIAGNOSIS — I4819 Other persistent atrial fibrillation: Secondary | ICD-10-CM | POA: Diagnosis not present

## 2023-11-05 DIAGNOSIS — M533 Sacrococcygeal disorders, not elsewhere classified: Secondary | ICD-10-CM | POA: Diagnosis not present

## 2023-11-24 DIAGNOSIS — M533 Sacrococcygeal disorders, not elsewhere classified: Secondary | ICD-10-CM | POA: Diagnosis not present
